# Patient Record
Sex: Female | Born: 1965 | Race: White | Hispanic: No | Marital: Married | Smoking: Former smoker
Health system: Southern US, Community
[De-identification: ages and names within clinical notes are randomized; demographics above are authoritative.]

## PROBLEM LIST (undated history)

## (undated) HISTORY — PX: OTHER SURGICAL HISTORY: SHX169

---

## 1997-08-02 ENCOUNTER — Emergency Department (HOSPITAL_COMMUNITY): Admission: EM | Admit: 1997-08-02 | Discharge: 1997-08-02 | Payer: Self-pay | Admitting: Emergency Medicine

## 1998-07-11 ENCOUNTER — Encounter: Payer: Self-pay | Admitting: Endocrinology

## 1998-07-11 ENCOUNTER — Emergency Department (HOSPITAL_COMMUNITY): Admission: EM | Admit: 1998-07-11 | Discharge: 1998-07-11 | Payer: Self-pay | Admitting: Endocrinology

## 1998-08-11 ENCOUNTER — Emergency Department (HOSPITAL_COMMUNITY): Admission: EM | Admit: 1998-08-11 | Discharge: 1998-08-11 | Payer: Self-pay | Admitting: Emergency Medicine

## 1998-08-11 ENCOUNTER — Encounter: Payer: Self-pay | Admitting: Emergency Medicine

## 1998-10-07 ENCOUNTER — Encounter: Admission: RE | Admit: 1998-10-07 | Discharge: 1999-01-05 | Payer: Self-pay | Admitting: Orthopedic Surgery

## 1998-10-09 ENCOUNTER — Emergency Department (HOSPITAL_COMMUNITY): Admission: EM | Admit: 1998-10-09 | Discharge: 1998-10-09 | Payer: Self-pay | Admitting: Emergency Medicine

## 2002-12-08 ENCOUNTER — Emergency Department (HOSPITAL_COMMUNITY): Admission: EM | Admit: 2002-12-08 | Discharge: 2002-12-08 | Payer: Self-pay | Admitting: Emergency Medicine

## 2005-02-22 ENCOUNTER — Emergency Department (HOSPITAL_COMMUNITY): Admission: EM | Admit: 2005-02-22 | Discharge: 2005-02-22 | Payer: Self-pay | Admitting: Emergency Medicine

## 2005-04-14 ENCOUNTER — Emergency Department (HOSPITAL_COMMUNITY): Admission: EM | Admit: 2005-04-14 | Discharge: 2005-04-14 | Payer: Self-pay | Admitting: Emergency Medicine

## 2007-08-26 ENCOUNTER — Emergency Department (HOSPITAL_COMMUNITY): Admission: EM | Admit: 2007-08-26 | Discharge: 2007-08-26 | Payer: Self-pay | Admitting: Emergency Medicine

## 2007-08-29 ENCOUNTER — Emergency Department (HOSPITAL_COMMUNITY): Admission: EM | Admit: 2007-08-29 | Discharge: 2007-08-29 | Payer: Self-pay | Admitting: Emergency Medicine

## 2010-11-09 ENCOUNTER — Emergency Department (HOSPITAL_COMMUNITY)
Admission: EM | Admit: 2010-11-09 | Discharge: 2010-11-10 | Discharge status: Against Medical Advice | Payer: PRIVATE HEALTH INSURANCE | Attending: Emergency Medicine | Admitting: Emergency Medicine

## 2010-11-09 DIAGNOSIS — R109 Unspecified abdominal pain: Secondary | ICD-10-CM | POA: Insufficient documentation

## 2010-11-09 LAB — POCT PREGNANCY, URINE: Preg Test, Ur: NEGATIVE

## 2010-11-10 LAB — URINALYSIS, ROUTINE W REFLEX MICROSCOPIC
Bilirubin Urine: NEGATIVE
Glucose, UA: NEGATIVE mg/dL
Hgb urine dipstick: NEGATIVE
Ketones, ur: NEGATIVE mg/dL
Leukocytes, UA: NEGATIVE
Nitrite: NEGATIVE
Protein, ur: NEGATIVE mg/dL
Specific Gravity, Urine: 1.006 (ref 1.005–1.030)
Urobilinogen, UA: 0.2 mg/dL (ref 0.0–1.0)
pH: 7 (ref 5.0–8.0)

## 2011-02-23 ENCOUNTER — Ambulatory Visit (INDEPENDENT_AMBULATORY_CARE_PROVIDER_SITE_OTHER): Payer: PRIVATE HEALTH INSURANCE | Admitting: Physician Assistant

## 2011-02-23 DIAGNOSIS — R197 Diarrhea, unspecified: Secondary | ICD-10-CM

## 2011-02-23 DIAGNOSIS — R112 Nausea with vomiting, unspecified: Secondary | ICD-10-CM

## 2011-02-23 MED ORDER — ONDANSETRON 4 MG PO TBDP: 4.0000 mg | ORAL_TABLET | Freq: Three times a day (TID) | ORAL | Status: DC | PRN

## 2011-02-23 MED ORDER — ONDANSETRON 4 MG PO TBDP
8.0000 mg | ORAL_TABLET | Freq: Once | ORAL | Status: AC
  Administered 2011-02-23: 8 mg via ORAL

## 2011-02-23 NOTE — Progress Notes (Signed)
  Subjective:    Patient ID: Leslie Morgan, female    DOB: 12-30-1965, 46 y.o.   MRN: [id number]  Emesis  This is a new (unable to keep any fluids down in >12h) problem. The current episode started yesterday. The problem occurs more than 10 times per day. The problem has been unchanged. Associated symptoms include chills, diarrhea and dizziness (with standing). Pertinent negatives include no abdominal pain or fever. She has tried nothing for the symptoms.  Diarrhea  This is a new problem. The current episode started yesterday. The problem occurs more than 10 times per day. The problem has been unchanged. The patient states that diarrhea awakens her from sleep. Associated symptoms include chills and vomiting. Pertinent negatives include no abdominal pain or fever. Risk factors include no known risk factors. She has tried nothing for the symptoms.      Review of Systems  Constitutional: Positive for chills. Negative for fever.  Gastrointestinal: Positive for vomiting and diarrhea. Negative for abdominal pain.  Neurological: Positive for dizziness (with standing) and weakness (generalized).       Objective:   Physical Exam  Constitutional: She appears well-developed and well-nourished. No distress.  HENT:  Head: Normocephalic and atraumatic.  Right Ear: External ear normal.  Left Ear: External ear normal.  Eyes: Pupils are equal, round, and reactive to light.  Cardiovascular: Normal rate, regular rhythm and normal heart sounds.   Pulmonary/Chest: Effort normal and breath sounds normal.  Abdominal: Soft.  Skin: She is not diaphoretic.          Assessment & Plan:   1. Nausea & vomiting  ondansetron (ZOFRAN-ODT) disintegrating tablet 8 mg, DISCONTINUED: ondansetron (ZOFRAN ODT) 4 MG disintegrating tablet, DISCONTINUED: ondansetron (ZOFRAN ODT) 4 MG disintegrating tablet  2. Diarrhea     Pt sent home after 2L NS IV feeling much better without any other episodes of vomiting. PT to  push fluids and advance diet as tolerated.

## 2013-10-03 ENCOUNTER — Telehealth: Payer: Self-pay | Admitting: Cardiovascular Disease

## 2013-10-03 NOTE — Telephone Encounter (Signed)
Closed encounter °

## 2013-11-08 ENCOUNTER — Ambulatory Visit: Payer: PRIVATE HEALTH INSURANCE | Admitting: Cardiovascular Disease

## 2014-01-06 ENCOUNTER — Emergency Department (HOSPITAL_COMMUNITY)
Admission: EM | Admit: 2014-01-06 | Discharge: 2014-01-06 | Disposition: A | Discharge status: Home / Self Care | Payer: PRIVATE HEALTH INSURANCE | Attending: Emergency Medicine | Admitting: Emergency Medicine

## 2014-01-06 ENCOUNTER — Encounter (HOSPITAL_COMMUNITY): Payer: Self-pay | Admitting: *Deleted

## 2014-01-06 ENCOUNTER — Emergency Department (HOSPITAL_COMMUNITY): Payer: PRIVATE HEALTH INSURANCE

## 2014-01-06 DIAGNOSIS — R52 Pain, unspecified: Secondary | ICD-10-CM

## 2014-01-06 DIAGNOSIS — M5431 Sciatica, right side: Secondary | ICD-10-CM | POA: Diagnosis not present

## 2014-01-06 DIAGNOSIS — Z79899 Other long term (current) drug therapy: Secondary | ICD-10-CM | POA: Insufficient documentation

## 2014-01-06 DIAGNOSIS — M549 Dorsalgia, unspecified: Secondary | ICD-10-CM | POA: Diagnosis present

## 2014-01-06 DIAGNOSIS — Z7952 Long term (current) use of systemic steroids: Secondary | ICD-10-CM | POA: Diagnosis not present

## 2014-01-06 DIAGNOSIS — Z720 Tobacco use: Secondary | ICD-10-CM | POA: Insufficient documentation

## 2014-01-06 DIAGNOSIS — Z791 Long term (current) use of non-steroidal anti-inflammatories (NSAID): Secondary | ICD-10-CM | POA: Diagnosis not present

## 2014-01-06 MED ORDER — OXYCODONE-ACETAMINOPHEN 5-325 MG PO TABS
1.0000 | ORAL_TABLET | Freq: Once | ORAL | Status: AC
  Administered 2014-01-06: 1 via ORAL
  Filled 2014-01-06: qty 1

## 2014-01-06 MED ORDER — DIAZEPAM 5 MG PO TABS
5.0000 mg | ORAL_TABLET | Freq: Once | ORAL | Status: AC
  Administered 2014-01-06: 5 mg via ORAL
  Filled 2014-01-06: qty 1

## 2014-01-06 MED ORDER — PREDNISONE 20 MG PO TABS
60.0000 mg | ORAL_TABLET | Freq: Once | ORAL | Status: AC
  Administered 2014-01-06: 60 mg via ORAL
  Filled 2014-01-06: qty 3

## 2014-01-06 MED ORDER — CYCLOBENZAPRINE HCL 10 MG PO TABS: 10.0000 mg | ORAL_TABLET | Freq: Two times a day (BID) | ORAL | Status: DC | PRN

## 2014-01-06 MED ORDER — OXYCODONE-ACETAMINOPHEN 5-325 MG PO TABS: 1.0000 | ORAL_TABLET | Freq: Four times a day (QID) | ORAL | Status: DC | PRN

## 2014-01-06 MED ORDER — ONDANSETRON 4 MG PO TBDP
4.0000 mg | ORAL_TABLET | Freq: Once | ORAL | Status: AC
  Administered 2014-01-06: 4 mg via ORAL
  Filled 2014-01-06: qty 1

## 2014-01-06 MED ORDER — PREDNISONE 10 MG PO TABS: 20.0000 mg | ORAL_TABLET | Freq: Every day | ORAL | Status: DC

## 2014-01-06 NOTE — ED Provider Notes (Signed)
CSN: [id number]     Arrival date & time 01/06/14  1427 History   First MD Initiated Contact with Patient 01/06/14 1627     Chief Complaint  Patient presents with  . Back Pain   The history is provided by the patient. No language interpreter was used.   This chart was scribed for non-physician practitioner Marlon Peliffany Lawson Isabell, PA-C,  working with Gray BernhardtWentz, Elliot MD, by Andrew Auaven Small, ED Scribe. This patient was seen in room WTR6/WTR6 and the patient's care was started at 7:29 AM.  Leslie Morgan is a 48 y.o. female who presents to the Emergency Department complaining of sudden, sharp mid back pain that radiates to right leg that began 4 days ago. Pt states pain began after getting off work suddenly. 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 Pt reports difficulty sleeping due to pain. She finds more comfort standing rather than sitting. Has been walking with limp due to pain. Pt has taken ibuprofen and tylenol, last does was 6 hours ago. Pt denies weakness in right extremity, abdominal pain, bladder and bowel incontinence.    History reviewed. No pertinent past medical history. History reviewed. No pertinent past surgical history. No family history on file. History  Substance Use Topics  . Smoking status: Current Every Day Smoker  . Smokeless tobacco: Not on file  . Alcohol Use: Not on file   OB History    No data available     Review of Systems  Gastrointestinal: Negative for abdominal pain.  Genitourinary: Negative for enuresis and difficulty urinating.  Musculoskeletal: Positive for back pain.   Allergies  Review of patient's allergies indicates no known allergies.  Home Medications   Prior to Admission medications   Medication Sig Start Date End Date Taking? Authorizing Provider  acetaminophen (TYLENOL) 500 MG tablet Take 1,000 mg by mouth every 6 (six) hours as needed for moderate pain.   Yes Historical Provider, MD  Heat Wraps (THERMACARE  MUSCLE/JOINT) MISC Apply 1 Package topically daily as needed (pain).   Yes Historical Provider, MD  ibuprofen (ADVIL,MOTRIN) 200 MG tablet Take 800 mg by mouth every 6 (six) hours as needed for moderate pain.   Yes Historical Provider, MD  naproxen sodium (ANAPROX) 220 MG tablet Take 440 mg by mouth 2 (two) times daily as needed (pain).   Yes Historical Provider, MD  cyclobenzaprine (FLEXERIL) 10 MG tablet Take 1 tablet (10 mg total) by mouth 2 (two) times daily as needed for muscle spasms. 01/06/14   Darus Hershman Irine SealG Khalea Ventura, PA-C  oxyCODONE-acetaminophen (PERCOCET/ROXICET) 5-325 MG per tablet Take 1-2 tablets by mouth every 6 (six) hours as needed for severe pain. 01/06/14   Joel Mericle Irine SealG Jniyah Dantuono, PA-C  predniSONE (DELTASONE) 10 MG tablet Take 2 tablets (20 mg total) by mouth daily. 01/06/14   Brean Carberry Irine SealG Zaidy Absher, PA-C   BP 110/72 mmHg  Pulse 71  Temp(Src) 98.1 F (36.7 C) (Oral)  Resp 18  Ht 5' (1.524 m)  Wt 118 lb (53.524 kg)  BMI 23.05 kg/m2  SpO2 100%  LMP 12/13/2013 Physical Exam  Constitutional: She is oriented to person, place, and time. She appears well-developed and well-nourished. No distress.  HENT:  Head: Normocephalic and atraumatic.  Eyes: Conjunctivae and EOM are normal.  Neck: Neck supple.  Cardiovascular: Normal rate.   Pulmonary/Chest: Effort normal.  Musculoskeletal: Normal range of motion.       Legs: Pt has equal strength to bilateral lower extremities.  Neurosensory function adequate  to both legs No clonus on dorsiflextion Skin color is normal. Skin is warm and moist.  I see no step off deformity, no midline bony tenderness.  Pt is able to ambulate.  No crepitus, laceration, effusion, induration, lesions, swelling.   Pedal pulses are symmetrical and palpable bilaterally  moderate tenderness to palpation of paraspinel muscles   Neurological: She is alert and oriented to person, place, and time.  Skin: Skin is warm and dry.  Psychiatric: She has a normal mood and affect.  Her behavior is normal.  Nursing note and vitals reviewed.   ED Course  Procedures (including critical care time) DIAGNOSTIC STUDIES: Oxygen Saturation is 100% on RA, normal by my interpretation.    COORDINATION OF CARE: 7:29 AM- Pt advised of plan for treatment and pt agrees.  Labs Review Labs Reviewed - No data to display  Imaging Review No results found.   EKG Interpretation None      MDM   Final diagnoses:  Pain  Sciatica neuralgia, right   Medications  oxyCODONE-acetaminophen (PERCOCET/ROXICET) 5-325 MG per tablet 1 tablet (1 tablet Oral Given 01/06/14 1645)  ondansetron (ZOFRAN-ODT) disintegrating tablet 4 mg (4 mg Oral Given 01/06/14 1645)  diazepam (VALIUM) tablet 5 mg (5 mg Oral Given 01/06/14 1730)  predniSONE (DELTASONE) tablet 60 mg (60 mg Oral Given 01/06/14 1730)   Medicines in ED significantly improved patients pain.  48 y.o.[name]'s  with R hip pain. No neurological deficits and normal neuro exam. Patient can walk. No loss of bowel or bladder control. No concern for cauda equina at this time base on HPI and physical exam findings. No fever, night sweats, weight loss, h/o cancer, IVDU.   RICE protocol and pain medicine indicated and discussed with patient.   Patient Plan 1. Medications: pain medication and muscle relaxer. Cont usual home medications unless otherwise directed. 2. Treatment: rest, drink plenty of fluids, gentle stretching as discussed, alternate ice and heat  3. Follow Up: Please followup with your primary doctor for discussion of your diagnoses and further evaluation after today's visit; if you do not have a primary care doctor use the resource guide provided to find one  Advised to follow-up with the orthopedist if symptoms do not start to resolve in the next 2-3 days. If develop loss of bowel or urinary control return to the ED as soon as possible for further evaluation. To take the medications as prescribed as they can cause harm  if not taken appropriately.   Vital signs are stable at discharge. Filed Vitals:   01/06/14 1735  BP: 110/72  Pulse: 71  Temp:   Resp: 18    Patient/guardian has voiced understanding and agreed to follow-up with the PCP or specialist.    I personally performed the services described in this documentation, which was scribed in my presence. The recorded information has been reviewed and is accurate.    Dorthula Matasiffany G Lionell Matuszak, PA-C 01/09/14 [phone]  Dorthula Matasiffany G Dyanara Cozza, PA-C 01/09/14 [id number]  Flint MelterElliott L Wentz, MD 01/11/14 [phone]

## 2014-01-06 NOTE — ED Notes (Signed)
Pt states she has back pain radiating to her right leg since Christmas eve night. Pt states she has taken ibuprofen, aleve, tylenol with no relief.

## 2014-01-06 NOTE — Discharge Instructions (Signed)
Sciatica Sciatica is pain, weakness, numbness, or tingling along the path of the sciatic nerve. The nerve starts in the lower back and runs down the back of each leg. The nerve controls the muscles in the lower leg and in the back of the knee, while also providing sensation to the back of the thigh, lower leg, and the sole of your foot. Sciatica is a symptom of another medical condition. For instance, nerve damage or certain conditions, such as a herniated disk or bone spur on the spine, pinch or put pressure on the sciatic nerve. This causes the pain, weakness, or other sensations normally associated with sciatica. Generally, sciatica only affects one side of the body. CAUSES   Herniated or slipped disc.  Degenerative disk disease.  A pain disorder involving the narrow muscle in the buttocks (piriformis syndrome).  Pelvic injury or fracture.  Pregnancy.  Tumor (rare). SYMPTOMS  Symptoms can vary from mild to very severe. The symptoms usually travel from the low back to the buttocks and down the back of the leg. Symptoms can include:  Mild tingling or dull aches in the lower back, leg, or hip.  Numbness in the back of the calf or sole of the foot.  Burning sensations in the lower back, leg, or hip.  Sharp pains in the lower back, leg, or hip.  Leg weakness.  Severe back pain inhibiting movement. These symptoms may get worse with coughing, sneezing, laughing, or prolonged sitting or standing. Also, being overweight may worsen symptoms. DIAGNOSIS  Your caregiver will perform a physical exam to look for common symptoms of sciatica. He or she may ask you to do certain movements or activities that would trigger sciatic nerve pain. Other tests may be performed to find the cause of the sciatica. These may include:  Blood tests.  X-rays.  Imaging tests, such as an MRI or CT scan. TREATMENT  Treatment is directed at the cause of the sciatic pain. Sometimes, treatment is not necessary  and the pain and discomfort goes away on its own. If treatment is needed, your caregiver may suggest:  Over-the-counter medicines to relieve pain.  Prescription medicines, such as anti-inflammatory medicine, muscle relaxants, or narcotics.  Applying heat or ice to the painful area.  Steroid injections to lessen pain, irritation, and inflammation around the nerve.  Reducing activity during periods of pain.  Exercising and stretching to strengthen your abdomen and improve flexibility of your spine. Your caregiver may suggest losing weight if the extra weight makes the back pain worse.  Physical therapy.  Surgery to eliminate what is pressing or pinching the nerve, such as a bone spur or part of a herniated disk. HOME CARE INSTRUCTIONS   Only take over-the-counter or prescription medicines for pain or discomfort as directed by your caregiver.  Apply ice to the affected area for 20 minutes, 3-4 times a day for the first 48-72 hours. Then try heat in the same way.  Exercise, stretch, or perform your usual activities if these do not aggravate your pain.  Attend physical therapy sessions as directed by your caregiver.  Keep all follow-up appointments as directed by your caregiver.  Do not wear high heels or shoes that do not provide proper support.  Check your mattress to see if it is too soft. A firm mattress may lessen your pain and discomfort. SEEK IMMEDIATE MEDICAL CARE IF:   You lose control of your bowel or bladder (incontinence).  You have increasing weakness in the lower back, pelvis, buttocks,   or legs.  You have redness or swelling of your back.  You have a burning sensation when you urinate.  You have pain that gets worse when you lie down or awakens you at night.  Your pain is worse than you have experienced in the past.  Your pain is lasting longer than 4 weeks.  You are suddenly losing weight without reason. MAKE SURE YOU:  Understand these  instructions.  Will watch your condition.  Will get help right away if you are not doing well or get worse. Document Released: 12/22/2000 Document Revised: 06/29/2011 Document Reviewed: 05/09/2011 Exit[employer] Patient Information 2015 [employer]. This information is not intended to replace advice given to you by your health care provider. Make sure you discuss any questions you have with your health care provider.  

## 2015-08-08 ENCOUNTER — Encounter (HOSPITAL_COMMUNITY): Payer: Self-pay | Admitting: Emergency Medicine

## 2015-08-08 ENCOUNTER — Emergency Department (HOSPITAL_COMMUNITY): Payer: PRIVATE HEALTH INSURANCE

## 2015-08-08 ENCOUNTER — Emergency Department (HOSPITAL_COMMUNITY)
Admission: EM | Admit: 2015-08-08 | Discharge: 2015-08-09 | Disposition: A | Discharge status: Home / Self Care | Payer: PRIVATE HEALTH INSURANCE | Attending: Emergency Medicine | Admitting: Emergency Medicine

## 2015-08-08 DIAGNOSIS — Y999 Unspecified external cause status: Secondary | ICD-10-CM | POA: Insufficient documentation

## 2015-08-08 DIAGNOSIS — Z5321 Procedure and treatment not carried out due to patient leaving prior to being seen by health care provider: Secondary | ICD-10-CM | POA: Diagnosis not present

## 2015-08-08 DIAGNOSIS — Y939 Activity, unspecified: Secondary | ICD-10-CM | POA: Diagnosis not present

## 2015-08-08 DIAGNOSIS — F17200 Nicotine dependence, unspecified, uncomplicated: Secondary | ICD-10-CM | POA: Diagnosis not present

## 2015-08-08 DIAGNOSIS — M25531 Pain in right wrist: Secondary | ICD-10-CM | POA: Insufficient documentation

## 2015-08-08 DIAGNOSIS — M79644 Pain in right finger(s): Secondary | ICD-10-CM | POA: Insufficient documentation

## 2015-08-08 DIAGNOSIS — Y92410 Unspecified street and highway as the place of occurrence of the external cause: Secondary | ICD-10-CM | POA: Diagnosis not present

## 2015-08-08 DIAGNOSIS — M25521 Pain in right elbow: Secondary | ICD-10-CM | POA: Insufficient documentation
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
## 2015-08-09 NOTE — ED Notes (Signed)
Called for pt in waiting room- no response

## 2016-04-22 ENCOUNTER — Encounter (HOSPITAL_COMMUNITY): Payer: Self-pay

## 2016-04-22 ENCOUNTER — Emergency Department (HOSPITAL_COMMUNITY)
Admission: EM | Admit: 2016-04-22 | Discharge: 2016-04-22 | Disposition: A | Discharge status: Home / Self Care | Payer: PRIVATE HEALTH INSURANCE | Attending: Emergency Medicine | Admitting: Emergency Medicine

## 2016-04-22 DIAGNOSIS — M5441 Lumbago with sciatica, right side: Secondary | ICD-10-CM | POA: Diagnosis not present

## 2016-04-22 DIAGNOSIS — Z79899 Other long term (current) drug therapy: Secondary | ICD-10-CM | POA: Insufficient documentation

## 2016-04-22 DIAGNOSIS — F17200 Nicotine dependence, unspecified, uncomplicated: Secondary | ICD-10-CM | POA: Insufficient documentation

## 2016-04-22 DIAGNOSIS — M545 Low back pain: Secondary | ICD-10-CM | POA: Diagnosis present

## 2016-04-22 MED ORDER — CYCLOBENZAPRINE HCL 5 MG PO TABS: 5.0000 mg | ORAL_TABLET | Freq: Three times a day (TID) | ORAL | 0 refills | Status: DC | PRN

## 2016-04-22 MED ORDER — HYDROCODONE-ACETAMINOPHEN 5-325 MG PO TABS: 1.0000 | ORAL_TABLET | Freq: Four times a day (QID) | ORAL | 0 refills | Status: DC | PRN

## 2016-04-22 MED ORDER — DEXAMETHASONE SODIUM PHOSPHATE 10 MG/ML IJ SOLN
4.0000 mg | Freq: Once | INTRAMUSCULAR | Status: AC
  Administered 2016-04-22: 4 mg via INTRAMUSCULAR
  Filled 2016-04-22: qty 1

## 2016-04-22 MED ORDER — PREDNISONE 20 MG PO TABS: ORAL_TABLET | ORAL | 0 refills | Status: DC

## 2016-04-22 MED ORDER — MORPHINE SULFATE (PF) 4 MG/ML IV SOLN
4.0000 mg | Freq: Once | INTRAVENOUS | Status: AC
  Administered 2016-04-22: 4 mg via INTRAMUSCULAR
  Filled 2016-04-22: qty 1

## 2016-04-22 MED ORDER — CYCLOBENZAPRINE HCL 10 MG PO TABS
5.0000 mg | ORAL_TABLET | Freq: Once | ORAL | Status: AC
Start: 2016-04-22 — End: 2016-04-22
  Administered 2016-04-22: 5 mg via ORAL
  Filled 2016-04-22: qty 1

## 2016-04-22 MED ORDER — HYDROCODONE-ACETAMINOPHEN 5-325 MG PO TABS: 2.0000 | ORAL_TABLET | Freq: Once | ORAL | Status: DC

## 2016-04-22 NOTE — ED Notes (Signed)
Pt stable, understands discharge instructions, and reasons for return.   

## 2016-04-22 NOTE — ED Provider Notes (Signed)
[facility]-EMERGENCY DEPT Provider Note   CSN: [id number] Arrival date & time: 04/22/16  1616  By signing my name below, I, [name], attest that this documentation has been prepared under the direction and in the presence of Charlynne Pander, MD. Electronically Signed: [name], Scribe. 04/22/16. 4:51 PM.  History   Chief Complaint Chief Complaint  Patient presents with  . Back Pain   HPI Comments: Leslie Morgan is a 51 y.o. female with no pertinent medical history, who presents to the Emergency Department complaining of sudden-onset, constant lower back pain radiating in the bilateral legs, that began 4-5 days ago. Patient has a history of sciatica, in which she was seen by a specialist and received a steroid injection in 2015, which significantly improved her pain. Patient states the last time she was here she had no relief with prednisone, narcotics, or flexeril. Patient was unable to tolerate oxycodone during last visit. Patient reports taking maximum strength ibuprofen with no relief in pain. Patient is ambulatory in the emergency department. Patient denies any injury, fall, weakness, numbness, or any other symptoms.   The history is provided by the patient. No language interpreter was used.    History reviewed. No pertinent past medical history.  There are no active problems to display for this patient.   History reviewed. No pertinent surgical history.  OB History    No data available       Home Medications    Prior to Admission medications   Medication Sig Start Date End Date Taking? Authorizing Provider  acetaminophen (TYLENOL) 500 MG tablet Take 1,000 mg by mouth every 6 (six) hours as needed for moderate pain.    Historical Provider, MD  cyclobenzaprine (FLEXERIL) 5 MG tablet Take 1 tablet (5 mg total) by mouth 3 (three) times daily as needed for muscle spasms. 04/22/16   Charlynne Pander, MD  Heat Wraps [facility] MUSCLE/JOINT) MISC Apply 1 Package topically  daily as needed (pain).    Historical Provider, MD  HYDROcodone-acetaminophen (NORCO/VICODIN) 5-325 MG tablet Take 1 tablet by mouth every 6 (six) hours as needed. 04/22/16   Charlynne Pander, MD  ibuprofen (ADVIL,MOTRIN) 200 MG tablet Take 800 mg by mouth every 6 (six) hours as needed for moderate pain.    Historical Provider, MD  naproxen sodium (ANAPROX) 220 MG tablet Take 440 mg by mouth 2 (two) times daily as needed (pain).    Historical Provider, MD  oxyCODONE-acetaminophen (PERCOCET/ROXICET) 5-325 MG per tablet Take 1-2 tablets by mouth every 6 (six) hours as needed for severe pain. 01/06/14   Tiffany Neva Seat, PA-C  predniSONE (DELTASONE) 20 MG tablet Take 60 mg daily x 2 days then 40 mg daily x 2 days then 20 mg daily x 2 days 04/22/16   Charlynne Pander, MD    Family History History reviewed. No pertinent family history.  Social History Social History  Substance Use Topics  . Smoking status: Current Every Day Smoker  . Smokeless tobacco: Never Used  . Alcohol use Yes     Allergies   Patient has no known allergies.   Review of Systems Review of Systems  Musculoskeletal: Positive for arthralgias (bilteral legs) and back pain (lower). Negative for gait problem and neck pain.  Neurological: Negative for weakness and numbness.  All other systems reviewed and are negative.    Physical Exam Updated Vital Signs BP 112/60 (BP Location: Left Arm)   Pulse 81   Temp 97.7 F (36.5 C) (Oral)   Resp  16   SpO2 97%   Physical Exam  Constitutional: She is oriented to person, place, and time. She appears well-developed and well-nourished.  HENT:  Head: Normocephalic.  Eyes: EOM are normal.  Neck: Normal range of motion.  Pulmonary/Chest: Effort normal.  Abdominal: She exhibits no distension.  Musculoskeletal: Normal range of motion. She exhibits tenderness. She exhibits no edema or deformity.  Diffuse para lumbar spasms. No midline deformity or tenderness.  Bilateral + straight  leg raise. Good reflexes.   Neurological: She is alert and oriented to person, place, and time.  Psychiatric: She has a normal mood and affect.  Nursing note and vitals reviewed.    ED Treatments / Results  DIAGNOSTIC STUDIES: Oxygen Saturation is 97% on RA, normal by my interpretation.    COORDINATION OF CARE: 4:51 PM Discussed treatment plan with pt at bedside and pt agreed to plan, which includes hydrocodone, flexeril, and a steroid shot.   Labs (all labs ordered are listed, but only abnormal results are displayed) Labs Reviewed - No data to display  EKG  EKG Interpretation None       Radiology No results found.  Procedures Procedures (including critical care time)  Medications Ordered in ED Medications  cyclobenzaprine (FLEXERIL) tablet 5 mg (5 mg Oral Given 04/22/16 1651)  dexamethasone (DECADRON) injection 4 mg (4 mg Intramuscular Given 04/22/16 1651)  morphine 4 MG/ML injection 4 mg (4 mg Intramuscular Given 04/22/16 1651)     Initial Impression / Assessment and Plan / ED Course  I have reviewed the triage vital signs and the nursing notes.  Pertinent labs & imaging results that were available during my care of the patient were reviewed by me and considered in my medical decision making (see chart for details).     Leslie Morgan is a 51 y.o. female here with back pain. Hx of sciatica previously. No fall or trauma or injury. Vitals stable. + straight leg raise bilaterally but neurovascular intact. Given decadron shot, morphine IM. Will dc home with course of steroids, motrin, flexeril, vicodin prn. Will refer to spine for possible cortisone shot or further evaluation.    Final Clinical Impressions(s) / ED Diagnoses   Final diagnoses:  Acute bilateral low back pain with right-sided sciatica    New Prescriptions New Prescriptions   CYCLOBENZAPRINE (FLEXERIL) 5 MG TABLET    Take 1 tablet (5 mg total) by mouth 3 (three) times daily as needed for muscle spasms.    HYDROCODONE-ACETAMINOPHEN (NORCO/VICODIN) 5-325 MG TABLET    Take 1 tablet by mouth every 6 (six) hours as needed.   PREDNISONE (DELTASONE) 20 MG TABLET    Take 60 mg daily x 2 days then 40 mg daily x 2 days then 20 mg daily x 2 days   I personally performed the services described in this documentation, which was scribed in my presence. The recorded information has been reviewed and is accurate.     Charlynne Pander, MD 04/22/16 [phone]

## 2016-04-22 NOTE — Discharge Instructions (Signed)
Take prednisone as prescribed.   Continue taking motrin for pain.  Take flexeril for muscle spasms.   Take vicodin for severe pain. Do NOT drive with it.   See your doctor. Consider calling Dr. Lonie Peak office for appointment for possible injection   Return to ER if you have severe back pain, unable to walk, weakness, numbness, trouble urinating

## 2016-04-22 NOTE — ED Triage Notes (Signed)
Pt states lumbar back pain radiating into her right leg. She reports using ibuprofen at home with minimal relief. Hx of trouble with her sciatic nerve requiring steroids.

## 2016-04-22 NOTE — ED Notes (Signed)
Pt c/o of recurring low back back radiating to bilateral lower extremities. Pt has taken ibuprofen  at home with no relief. Has had steroid injection in the past with full relief. See providers assessment.

## 2016-05-29 ENCOUNTER — Emergency Department (HOSPITAL_COMMUNITY)
Admission: EM | Admit: 2016-05-29 | Discharge: 2016-05-29 | Disposition: A | Discharge status: Home / Self Care | Payer: PRIVATE HEALTH INSURANCE | Attending: Emergency Medicine | Admitting: Emergency Medicine

## 2016-05-29 ENCOUNTER — Encounter (HOSPITAL_COMMUNITY): Payer: Self-pay | Admitting: Emergency Medicine

## 2016-05-29 DIAGNOSIS — Z5321 Procedure and treatment not carried out due to patient leaving prior to being seen by health care provider: Secondary | ICD-10-CM | POA: Insufficient documentation

## 2016-05-29 DIAGNOSIS — R21 Rash and other nonspecific skin eruption: Secondary | ICD-10-CM | POA: Insufficient documentation

## 2016-05-29 NOTE — ED Triage Notes (Signed)
Pt reports that she has a rash on her right leg, believes it is from poison ivy/oak from working in the yard a week ago.  Rash is on the front and back of her right leg, she has tried home cures but the rash continues to get worse.

## 2016-05-29 NOTE — ED Notes (Signed)
Pt name called x 2 with no response  

## 2016-05-30 ENCOUNTER — Emergency Department (HOSPITAL_COMMUNITY)
Admission: EM | Admit: 2016-05-30 | Discharge: 2016-05-30 | Disposition: A | Discharge status: Home / Self Care | Payer: PRIVATE HEALTH INSURANCE | Attending: Emergency Medicine | Admitting: Emergency Medicine

## 2016-05-30 ENCOUNTER — Encounter (HOSPITAL_COMMUNITY): Payer: Self-pay | Admitting: Emergency Medicine

## 2016-05-30 DIAGNOSIS — F17210 Nicotine dependence, cigarettes, uncomplicated: Secondary | ICD-10-CM | POA: Insufficient documentation

## 2016-05-30 DIAGNOSIS — B354 Tinea corporis: Secondary | ICD-10-CM | POA: Diagnosis not present

## 2016-05-30 DIAGNOSIS — R21 Rash and other nonspecific skin eruption: Secondary | ICD-10-CM

## 2016-05-30 MED ORDER — CLOTRIMAZOLE-BETAMETHASONE 1-0.05 % EX CREA: TOPICAL_CREAM | CUTANEOUS | 0 refills | Status: DC

## 2016-05-30 NOTE — ED Triage Notes (Signed)
Pt c/o rash to right leg x 1 week. Pt reports that she was working out in the yard recently. Pt has tried over the counter medications without relief.

## 2016-05-30 NOTE — ED Notes (Signed)
Declined W/C at D/C and was escorted to lobby by RN. 

## 2016-05-30 NOTE — ED Provider Notes (Signed)
[facility]-EMERGENCY DEPT Provider Note   CSN: [id number] Arrival date & time: 05/30/16  1245  By signing my name below, I, [name], attest that this documentation has been prepared under the direction and in the presence of Benjiman CorePickering, Domnique Vantine, MD. Electronically Signed: [name], ED Scribe. 05/30/16. 1:31 PM.  History   Chief Complaint Chief Complaint  Patient presents with  . Rash   The history is provided by the patient. No language interpreter was used.  Rash   The current episode started more than 1 week ago. The problem has been gradually worsening. The problem is associated with an unknown factor.    HPI Comments: Leslie Morgan is a 51 y.o. female with no pertinent PMHx, who presents to the Emergency Department complaining of sudden onset, profuse, localized right leg rash that started one week ago. Her associated symptoms include itching and burning sensation to the area. Pt notes last week while doing yard work there was a sudden onset of a rash to her right thigh, that  has since spread to the distal aspect of the thigh. At the time of the onset she notes she was wearing jeans. She denies any known contact with poison ivy or oak. She has tried Calamine lotion and Benadryl with no relief. No acute associated symptoms noted at this time.   History reviewed. No pertinent past medical history.  There are no active problems to display for this patient.   Past Surgical History:  Procedure Laterality Date  . arm surgery      OB History    No data available       Home Medications    Prior to Admission medications   Medication Sig Start Date End Date Taking? Authorizing Provider  acetaminophen (TYLENOL) 500 MG tablet Take 1,000 mg by mouth every 6 (six) hours as needed for moderate pain.    [provider]  clotrimazole-betamethasone (LOTRISONE) cream Apply to affected area 2 times daily prn 05/30/16   Benjiman CorePickering, Shamica Moree, MD  cyclobenzaprine (FLEXERIL) 5 MG tablet  Take 1 tablet (5 mg total) by mouth 3 (three) times daily as needed for muscle spasms. 04/22/16   Charlynne PanderYao, David Hsienta, MD  Heat Wraps [facility](THERMACARE MUSCLE/JOINT) MISC Apply 1 Package topically daily as needed (pain).    [provider]  HYDROcodone-acetaminophen (NORCO/VICODIN) 5-325 MG tablet Take 1 tablet by mouth every 6 (six) hours as needed. 04/22/16   Charlynne PanderYao, David Hsienta, MD  ibuprofen (ADVIL,MOTRIN) 200 MG tablet Take 800 mg by mouth every 6 (six) hours as needed for moderate pain.    [provider]  naproxen sodium (ANAPROX) 220 MG tablet Take 440 mg by mouth 2 (two) times daily as needed (pain).    [provider]  oxyCODONE-acetaminophen (PERCOCET/ROXICET) 5-325 MG per tablet Take 1-2 tablets by mouth every 6 (six) hours as needed for severe pain. 01/06/14   Marlon PelGreene, Tiffany, PA-C  predniSONE (DELTASONE) 20 MG tablet Take 60 mg daily x 2 days then 40 mg daily x 2 days then 20 mg daily x 2 days 04/22/16   Charlynne PanderYao, David Hsienta, MD    Family History No family history on file.  Social History Social History  Substance Use Topics  . Smoking status: Current Every Day Smoker    Packs/day: 0.50    Types: Cigarettes  . Smokeless tobacco: Never Used  . Alcohol use Yes     Allergies   Patient has no known allergies.   Review of Systems Review of Systems  Skin:  Positive for rash.       Burning sensation.      Physical Exam Updated Vital Signs BP (!) 132/93   Pulse 69   Temp 98.6 F (37 C) (Oral)   Resp 18   Ht 5' (1.524 m)   Wt 120 lb (54.4 kg)   LMP 05/24/2016   SpO2 100%   BMI 23.44 kg/m   Physical Exam  Constitutional: She is oriented to person, place, and time. She appears well-developed and well-nourished.  HENT:  Head: Normocephalic and atraumatic.  Cardiovascular: Normal rate.   Pulmonary/Chest: Effort normal.  Neurological: She is alert and oriented to person, place, and time.  Skin: Skin is warm and dry. Rash noted.  Multiple red  plaques right thigh. Is on anterior and posterior. No groin involvement. Her mildly raised slightly scaled. Appears somewhat uniform as opposed to ringed. no drainage.  Psychiatric: She has a normal mood and affect.  Nursing note and vitals reviewed.    ED Treatments / Results  COORDINATION OF CARE: 1:16 PM-Discussed next steps with pt. Pt verbalized understanding and is agreeable with the plan.    Labs (all labs ordered are listed, but only abnormal results are displayed)  Results for orders placed or performed during the hospital encounter of 11/09/10  Urinalysis, Routine w reflex microscopic  Result Value Ref Range   Color, Urine YELLOW YELLOW   APPearance CLEAR CLEAR   Specific Gravity, Urine 1.006 1.005 - 1.030   pH 7.0 5.0 - 8.0   Glucose, UA NEGATIVE NEGATIVE mg/dL   Hgb urine dipstick NEGATIVE NEGATIVE   Bilirubin Urine NEGATIVE NEGATIVE   Ketones, ur NEGATIVE NEGATIVE mg/dL   Protein, ur NEGATIVE NEGATIVE mg/dL   Urobilinogen, UA 0.2 0.0 - 1.0 mg/dL   Nitrite NEGATIVE NEGATIVE   Leukocytes, UA NEGATIVE NEGATIVE  Pregnancy, urine POC  Result Value Ref Range   Preg Test, Ur NEGATIVE    No results found.  EKG  EKG Interpretation None       Radiology No results found.  Procedures Procedures (including critical care time)  Medications Ordered in ED Medications - No data to display   Initial Impression / Assessment and Plan / ED Course  I have reviewed the triage vital signs and the nursing notes.  Pertinent labs & imaging results that were available during my care of the patient were reviewed by me and considered in my medical decision making (see chart for details).   patient with itchy areas to right thigh. May be tinea. Will treat with antifungal corticosteroid combination. Follow-up as needed.  Final Clinical Impressions(s) / ED Diagnoses   Final diagnoses:  Rash and nonspecific skin eruption  Tinea corporis    New Prescriptions New  Prescriptions   CLOTRIMAZOLE-BETAMETHASONE (LOTRISONE) CREAM    Apply to affected area 2 times daily prn  I personally performed the services described in this documentation, which was scribed in my presence. The recorded information has been reviewed and is accurate.       Benjiman Core, MD 05/30/16 [phone]

## 2016-09-26 ENCOUNTER — Emergency Department (HOSPITAL_COMMUNITY)
Admission: EM | Admit: 2016-09-26 | Discharge: 2016-09-26 | Disposition: A | Discharge status: Home / Self Care | Payer: PRIVATE HEALTH INSURANCE | Attending: Emergency Medicine | Admitting: Emergency Medicine

## 2016-09-26 ENCOUNTER — Encounter (HOSPITAL_COMMUNITY): Payer: Self-pay | Admitting: Emergency Medicine

## 2016-09-26 DIAGNOSIS — F17210 Nicotine dependence, cigarettes, uncomplicated: Secondary | ICD-10-CM | POA: Diagnosis not present

## 2016-09-26 DIAGNOSIS — Y658 Other specified misadventures during surgical and medical care: Secondary | ICD-10-CM | POA: Diagnosis not present

## 2016-09-26 DIAGNOSIS — Y929 Unspecified place or not applicable: Secondary | ICD-10-CM | POA: Insufficient documentation

## 2016-09-26 DIAGNOSIS — Z79899 Other long term (current) drug therapy: Secondary | ICD-10-CM | POA: Insufficient documentation

## 2016-09-26 DIAGNOSIS — T161XXA Foreign body in right ear, initial encounter: Secondary | ICD-10-CM | POA: Insufficient documentation

## 2016-09-26 DIAGNOSIS — Y93E8 Activity, other personal hygiene: Secondary | ICD-10-CM | POA: Diagnosis not present

## 2016-09-26 DIAGNOSIS — Y999 Unspecified external cause status: Secondary | ICD-10-CM | POA: Diagnosis not present

## 2016-09-26 DIAGNOSIS — H9201 Otalgia, right ear: Secondary | ICD-10-CM | POA: Diagnosis present

## 2016-09-26 MED ORDER — HYDROGEN PEROXIDE 3 % EX SOLN
CUTANEOUS | Status: AC
  Administered 2016-09-26: 17:00:00
  Filled 2016-09-26: qty 473

## 2016-09-26 MED ORDER — AMOXICILLIN 500 MG PO CAPS: 500.0000 mg | ORAL_CAPSULE | Freq: Two times a day (BID) | ORAL | 0 refills | Status: DC

## 2016-09-26 MED ORDER — NEOMYCIN-POLYMYXIN-HC 3.5-10000-1 OT SUSP: 4.0000 [drp] | Freq: Three times a day (TID) | OTIC | 0 refills | Status: AC

## 2016-09-26 NOTE — ED Triage Notes (Signed)
Patient c/o right ear fullness since using a qtip in it. Denies drainage.

## 2016-09-26 NOTE — Discharge Instructions (Signed)
Take medications as prescribed for 7 days. Do not put Q-tips in your ear. Please return to the emergency department if he develop any new or worsening symptoms. If your symptoms are persisting, please follow-up with the ear nose and throat doctor, Dr. Annalee Genta.

## 2016-09-26 NOTE — ED Provider Notes (Signed)
[facility]-EMERGENCY DEPT Provider Note   CSN: [id number] Arrival date & time: 09/26/16  1434     History   Chief Complaint Chief Complaint  Patient presents with  . Ear Fullness    HPI Leslie Morgan is a 51 y.o. female is a previously healthy who presents with a one-day history of right ear fullness after cleaning it with Q-tip. Patient believes piece of a Q-tip came off in her ear. She has used earwax removal eardrops without relief. She reports irritation, but no significant pain. She denies any fevers. She denies any other symptoms including nasal congestion.  HPI  History reviewed. No pertinent past medical history.  There are no active problems to display for this patient.   Past Surgical History:  Procedure Laterality Date  . arm surgery      OB History    No data available       Home Medications    Prior to Admission medications   Medication Sig Start Date End Date Taking? Authorizing Provider  acetaminophen (TYLENOL) 500 MG tablet Take 1,000 mg by mouth every 6 (six) hours as needed for moderate pain.    [provider]  amoxicillin (AMOXIL) 500 MG capsule Take 1 capsule (500 mg total) by mouth 2 (two) times daily. 09/26/16   Emi Holes, PA-C  clotrimazole-betamethasone (LOTRISONE) cream Apply to affected area 2 times daily prn 05/30/16   Benjiman Core, MD  cyclobenzaprine (FLEXERIL) 5 MG tablet Take 1 tablet (5 mg total) by mouth 3 (three) times daily as needed for muscle spasms. 04/22/16   Charlynne Pander, MD  Heat Wraps [facility] MUSCLE/JOINT) MISC Apply 1 Package topically daily as needed (pain).    [provider]  HYDROcodone-acetaminophen (NORCO/VICODIN) 5-325 MG tablet Take 1 tablet by mouth every 6 (six) hours as needed. 04/22/16   Charlynne Pander, MD  ibuprofen (ADVIL,MOTRIN) 200 MG tablet Take 800 mg by mouth every 6 (six) hours as needed for moderate pain.    [provider]  naproxen sodium (ANAPROX) 220 MG  tablet Take 440 mg by mouth 2 (two) times daily as needed (pain).    [provider]  neomycin-polymyxin-hydrocortisone (CORTISPORIN) 3.5-10000-1 OTIC suspension Place 4 drops into the right ear 3 (three) times daily. 09/26/16 10/03/16  Emi Holes, PA-C  oxyCODONE-acetaminophen (PERCOCET/ROXICET) 5-325 MG per tablet Take 1-2 tablets by mouth every 6 (six) hours as needed for severe pain. 01/06/14   Marlon Pel, PA-C  predniSONE (DELTASONE) 20 MG tablet Take 60 mg daily x 2 days then 40 mg daily x 2 days then 20 mg daily x 2 days 04/22/16   Charlynne Pander, MD    Family History History reviewed. No pertinent family history.  Social History Social History  Substance Use Topics  . Smoking status: Current Every Day Smoker    Packs/day: 0.50    Types: Cigarettes  . Smokeless tobacco: Never Used  . Alcohol use Yes     Allergies   Patient has no known allergies.   Review of Systems Review of Systems  HENT: Positive for ear pain.      Physical Exam Updated Vital Signs BP 104/75 (BP Location: Left Arm)   Pulse 69   Temp 97.9 F (36.6 C) (Oral)   Resp 16   SpO2 99%   Physical Exam  Constitutional: She appears well-developed and well-nourished. No distress.  HENT:  Head: Normocephalic and atraumatic.  Right Ear: A foreign body is present. No mastoid tenderness. Tympanic membrane  is erythematous.  Left Ear: Tympanic membrane normal.  Mouth/Throat: Oropharynx is clear and moist. No oropharyngeal exudate.  Eyes: Pupils are equal, round, and reactive to light. Conjunctivae are normal. Right eye exhibits no discharge. Left eye exhibits no discharge. No scleral icterus.  Neck: Normal range of motion. Neck supple. No thyromegaly present.  Cardiovascular: Normal rate, regular rhythm, normal heart sounds and intact distal pulses.  Exam reveals no gallop and no friction rub.   No murmur heard. Pulmonary/Chest: Effort normal and breath sounds normal. No stridor. No  respiratory distress. She has no wheezes. She has no rales.  Musculoskeletal: She exhibits no edema.  Lymphadenopathy:    She has no cervical adenopathy.  Neurological: She is alert. Coordination normal.  Skin: Skin is warm and dry. No rash noted. She is not diaphoretic. No pallor.  Psychiatric: She has a normal mood and affect.  Nursing note and vitals reviewed.    ED Treatments / Results  Labs (all labs ordered are listed, but only abnormal results are displayed) Labs Reviewed - No data to display  EKG  EKG Interpretation None       Radiology No results found.  Procedures Procedures (including critical care time)  Medications Ordered in ED Medications  hydrogen peroxide 3 % external solution (  Given 09/26/16 1717)     Initial Impression / Assessment and Plan / ED Course  I have reviewed the triage vital signs and the nursing notes.  Pertinent labs & imaging results that were available during my care of the patient were reviewed by me and considered in my medical decision making (see chart for details).     Irrigation of the right ear conducted by nursing staff which removed wax and what looked to be cotton from Q-tip. On evaluation, canal mildly erythematous and TM erythematous and dull. We'll discharge home with coverage for otitis media and prophylactic treatment for externa after irrigation and foreign body. Discharge home with amoxicillin and Cortisporin. Return precautions discussed. Follow-up to ENT if symptoms are persistent. Patient understands and agrees with plan. Patient vitals stable throughout ED course and discharged in satisfactory condition.  Final Clinical Impressions(s) / ED Diagnoses   Final diagnoses:  Foreign body of right ear, initial encounter  Otalgia of right ear    New Prescriptions Discharge Medication List as of 09/26/2016  5:11 PM    START taking these medications   Details  amoxicillin (AMOXIL) 500 MG capsule Take 1 capsule (500  mg total) by mouth 2 (two) times daily., Starting Sun 09/26/2016, Print    neomycin-polymyxin-hydrocortisone (CORTISPORIN) 3.5-10000-1 OTIC suspension Place 4 drops into the right ear 3 (three) times daily., Starting Sun 09/26/2016, Until Sun 10/03/2016, Print          Valentine Kuechle, Waylan Boga, PA-C 09/26/16 Jacqlyn Krauss, MD 09/29/16 [phone]

## 2016-09-26 NOTE — ED Notes (Signed)
Pt's ear flushed with equal parts hydrogen peroxide and warm water.  Ear wax and cotton came out.  PA made aware. Pt reports improvement in right ear pain.

## 2018-10-19 ENCOUNTER — Ambulatory Visit: Payer: PRIVATE HEALTH INSURANCE | Admitting: Family Medicine

## 2018-10-19 ENCOUNTER — Other Ambulatory Visit: Payer: Self-pay

## 2018-10-19 VITALS — BP 92/80 | HR 71 | Temp 97.2°F | Resp 18

## 2018-10-19 DIAGNOSIS — B349 Viral infection, unspecified: Secondary | ICD-10-CM

## 2018-10-19 NOTE — Patient Instructions (Addendum)
Please stay out of work until you know your covid-19 test results and symptoms have improved. Please call and leave a message with your covid-19 test result at ext. [phone]    COVID-19 COVID-19 is a respiratory infection that is caused by a virus called severe acute respiratory syndrome coronavirus 2 (SARS-CoV-2). The disease is also known as coronavirus disease or novel coronavirus. In some people, the virus may not cause any symptoms. In others, it may cause a serious infection. The infection can get worse quickly and can lead to complications, such as:  Pneumonia, or infection of the lungs.  Acute respiratory distress syndrome or ARDS. This is fluid build-up in the lungs.  Acute respiratory failure. This is a condition in which there is not enough oxygen passing from the lungs to the body.  Sepsis or septic shock. This is a serious bodily reaction to an infection.  Blood clotting problems.  Secondary infections due to bacteria or fungus. The virus that causes COVID-19 is contagious. This means that it can spread from person to person through droplets from coughs and sneezes (respiratory secretions). What are the causes? This illness is caused by a virus. You may catch the virus by:  Breathing in droplets from an infected person's cough or sneeze.  Touching something, like a table or a doorknob, that was exposed to the virus (contaminated) and then touching your mouth, nose, or eyes. What increases the risk? Risk for infection You are more likely to be infected with this virus if you:  Live in or travel to an area with a COVID-19 outbreak.  Come in contact with a sick person who recently traveled to an area with a COVID-19 outbreak.  Provide care for or live with a person who is infected with COVID-19. Risk for serious illness You are more likely to become seriously ill from the virus if you:  Are 3 years of age or older.  Have a long-term disease that lowers your body's  ability to fight infection (immunocompromised).  Live in a nursing home or long-term care facility.  Have a long-term (chronic) disease such as: ? Chronic lung disease, including chronic obstructive pulmonary disease or asthma ? Heart disease. ? Diabetes. ? Chronic kidney disease. ? Liver disease.  Are obese. What are the signs or symptoms? Symptoms of this condition can range from mild to severe. Symptoms may appear any time from 2 to 14 days after being exposed to the virus. They include:  A fever.  A cough.  Difficulty breathing.  Chills.  Muscle pains.  A sore throat.  Loss of taste or smell. Some people may also have stomach problems, such as nausea, vomiting, or diarrhea. Other people may not have any symptoms of COVID-19. How is this diagnosed? This condition may be diagnosed based on:  Your signs and symptoms, especially if: ? You live in an area with a COVID-19 outbreak. ? You recently traveled to or from an area where the virus is common. ? You provide care for or live with a person who was diagnosed with COVID-19.  A physical exam.  Lab tests, which may include: ? A nasal swab to take a sample of fluid from your nose. ? A throat swab to take a sample of fluid from your throat. ? A sample of mucus from your lungs (sputum). ? Blood tests.  Imaging tests, which may include, X-rays, CT scan, or ultrasound. How is this treated? At present, there is no medicine to treat COVID-19. Medicines that treat  other diseases are being used on a trial basis to see if they are effective against COVID-19. Your health care provider will talk with you about ways to treat your symptoms. For most people, the infection is mild and can be managed at home with rest, fluids, and over-the-counter medicines. Treatment for a serious infection usually takes places in a hospital intensive care unit (ICU). It may include one or more of the following treatments. These treatments are given  until your symptoms improve.  Receiving fluids and medicines through an IV.  Supplemental oxygen. Extra oxygen is given through a tube in the nose, a face mask, or a hood.  Positioning you to lie on your stomach (prone position). This makes it easier for oxygen to get into the lungs.  Continuous positive airway pressure (CPAP) or bi-level positive airway pressure (BPAP) machine. This treatment uses mild air pressure to keep the airways open. A tube that is connected to a motor delivers oxygen to the body.  Ventilator. This treatment moves air into and out of the lungs by using a tube that is placed in your windpipe.  Tracheostomy. This is a procedure to create a hole in the neck so that a breathing tube can be inserted.  Extracorporeal membrane oxygenation (ECMO). This procedure gives the lungs a chance to recover by taking over the functions of the heart and lungs. It supplies oxygen to the body and removes carbon dioxide. Follow these instructions at home: Lifestyle  If you are sick, stay home except to get medical care. Your health care provider will tell you how long to stay home. Call your health care provider before you go for medical care.  Rest at home as told by your health care provider.  Do not use any products that contain nicotine or tobacco, such as cigarettes, e-cigarettes, and chewing tobacco. If you need help quitting, ask your health care provider.  Return to your normal activities as told by your health care provider. Ask your health care provider what activities are safe for you. General instructions  Take over-the-counter and prescription medicines only as told by your health care provider.  Drink enough fluid to keep your urine pale yellow.  Keep all follow-up visits as told by your health care provider. This is important. How is this prevented?  There is no vaccine to help prevent COVID-19 infection. However, there are steps you can take to protect yourself  and others from this virus. To protect yourself:   Do not travel to areas where COVID-19 is a risk. The areas where COVID-19 is reported change often. To identify high-risk areas and travel restrictions, check the CDC travel website: StageSync.si  If you live in, or must travel to, an area where COVID-19 is a risk, take precautions to avoid infection. ? Stay away from people who are sick. ? Wash your hands often with soap and water for 20 seconds. If soap and water are not available, use an alcohol-based hand sanitizer. ? Avoid touching your mouth, face, eyes, or nose. ? Avoid going out in public, follow guidance from your state and local health authorities. ? If you must go out in public, wear a cloth face covering or face mask. ? Disinfect objects and surfaces that are frequently touched every day. This may include:  Counters and tables.  Doorknobs and light switches.  Sinks and faucets.  Electronics, such as phones, remote controls, keyboards, computers, and tablets. To protect others: If you have symptoms of COVID-19, take steps to  prevent the virus from spreading to others.  If you think you have a COVID-19 infection, contact your health care provider right away. Tell your health care team that you think you may have a COVID-19 infection.  Stay home. Leave your house only to seek medical care. Do not use public transport.  Do not travel while you are sick.  Wash your hands often with soap and water for 20 seconds. If soap and water are not available, use alcohol-based hand sanitizer.  Stay away from other members of your household. Let healthy household members care for children and pets, if possible. If you have to care for children or pets, wash your hands often and wear a mask. If possible, stay in your own room, separate from others. Use a different bathroom.  Make sure that all people in your household wash their hands well and often.  Cough or sneeze into  a tissue or your sleeve or elbow. Do not cough or sneeze into your hand or into the air.  Wear a cloth face covering or face mask. Where to find more information  Centers for Disease Control and Prevention: StickerEmporium.tnwww.cdc.gov/coronavirus/2019-ncov/index.html  World Health Organization: https://thompson-craig.com/www.who.int/health-topics/coronavirus Contact a health care provider if:  You live in or have traveled to an area where COVID-19 is a risk and you have symptoms of the infection.  You have had contact with someone who has COVID-19 and you have symptoms of the infection. Get help right away if:  You have trouble breathing.  You have pain or pressure in your chest.  You have confusion.  You have bluish lips and fingernails.  You have difficulty waking from sleep.  You have symptoms that get worse. These symptoms may represent a serious problem that is an emergency. Do not wait to see if the symptoms will go away. Get medical help right away. Call your local emergency services (911 in the [place] Do not drive yourself to the hospital. Let the emergency medical personnel know if you think you have COVID-19. Summary  COVID-19 is a respiratory infection that is caused by a virus. It is also known as coronavirus disease or novel coronavirus. It can cause serious infections, such as pneumonia, acute respiratory distress syndrome, acute respiratory failure, or sepsis.  The virus that causes COVID-19 is contagious. This means that it can spread from person to person through droplets from coughs and sneezes.  You are more likely to develop a serious illness if you are 51 years of age or older, have a weak immunity, live in a nursing home, or have chronic disease.  There is no medicine to treat COVID-19. Your health care provider will talk with you about ways to treat your symptoms.  Take steps to protect yourself and others from infection. Wash your hands often and disinfect objects and surfaces that are frequently  touched every day. Stay away from people who are sick and wear a mask if you are sick. This information is not intended to replace advice given to you by your health care provider. Make sure you discuss any questions you have with your health care provider. Document Released: 02/02/2018 Document Revised: 05/25/2018 Document Reviewed: 02/02/2018 Elsevier Patient Education  2020 [employer] Sinusitis, Adult Sinusitis is soreness and swelling (inflammation) of your sinuses. Sinuses are hollow spaces in the bones around your face. They are located:  Around your eyes.  In the middle of your forehead.  Behind your nose.  In your cheekbones. Your sinuses and nasal passages are lined  with a fluid called mucus. Mucus drains out of your sinuses. Swelling can trap mucus in your sinuses. This lets germs (bacteria, virus, or fungus) grow, which leads to infection. Most of the time, this condition is caused by a virus. What are the causes? This condition is caused by:  Allergies.  Asthma.  Germs.  Things that block your nose or sinuses.  Growths in the nose (nasal polyps).  Chemicals or irritants in the air.  Fungus (rare). What increases the risk? You are more likely to develop this condition if:  You have a weak body defense system (immune system).  You do a lot of swimming or diving.  You use nasal sprays too much.  You smoke. What are the signs or symptoms? The main symptoms of this condition are pain and a feeling of pressure around the sinuses. Other symptoms include:  Stuffy nose (congestion).  Runny nose (drainage).  Swelling and warmth in the sinuses.  Headache.  Toothache.  A cough that may get worse at night.  Mucus that collects in the throat or the back of the nose (postnasal drip).  Being unable to smell and taste.  Being very tired (fatigue).  A fever.  Sore throat.  Bad breath. How is this diagnosed? This condition is diagnosed based  on:  Your symptoms.  Your medical history.  A physical exam.  Tests to find out if your condition is short-term (acute) or long-term (chronic). Your doctor may: ? Check your nose for growths (polyps). ? Check your sinuses using a tool that has a light (endoscope). ? Check for allergies or germs. ? Do imaging tests, such as an MRI or CT scan. How is this treated? Treatment for this condition depends on the cause and whether it is short-term or long-term.  If caused by a virus, your symptoms should go away on their own within 10 days. You may be given medicines to relieve symptoms. They include: ? Medicines that shrink swollen tissue in the nose. ? Medicines that treat allergies (antihistamines). ? A spray that treats swelling of the nostrils. ? Rinses that help get rid of thick mucus in your nose (nasal saline washes).  If caused by bacteria, your doctor may wait to see if you will get better without treatment. You may be given antibiotic medicine if you have: ? A very bad infection. ? A weak body defense system.  If caused by growths in the nose, you may need to have surgery. Follow these instructions at home: Medicines  Take, use, or apply over-the-counter and prescription medicines only as told by your doctor. These may include nasal sprays.  If you were prescribed an antibiotic medicine, take it as told by your doctor. Do not stop taking the antibiotic even if you start to feel better. Hydrate and humidify   Drink enough water to keep your pee (urine) pale yellow.  Use a cool mist humidifier to keep the humidity level in your home above 50%.  Breathe in steam for 10-15 minutes, 3-4 times a day, or as told by your doctor. You can do this in the bathroom while a hot shower is running.  Try not to spend time in cool or dry air. Rest  Rest as much as you can.  Sleep with your head raised (elevated).  Make sure you get enough sleep each night. General  instructions   Put a warm, moist washcloth on your face 3-4 times a day, or as often as told by your doctor. This  will help with discomfort.  Wash your hands often with soap and water. If there is no soap and water, use hand sanitizer.  Do not smoke. Avoid being around people who are smoking (secondhand smoke).  Keep all follow-up visits as told by your doctor. This is important. Contact a doctor if:  You have a fever.  Your symptoms get worse.  Your symptoms do not get better within 10 days. Get help right away if:  You have a very bad headache.  You cannot stop throwing up (vomiting).  You have very bad pain or swelling around your face or eyes.  You have trouble seeing.  You feel confused.  Your neck is stiff.  You have trouble breathing. Summary  Sinusitis is swelling of your sinuses. Sinuses are hollow spaces in the bones around your face.  This condition is caused by tissues in your nose that become inflamed or swollen. This traps germs. These can lead to infection.  If you were prescribed an antibiotic medicine, take it as told by your doctor. Do not stop taking it even if you start to feel better.  Keep all follow-up visits as told by your doctor. This is important. This information is not intended to replace advice given to you by your health care provider. Make sure you discuss any questions you have with your health care provider. Document Released: 06/16/2007 Document Revised: 05/30/2017 Document Reviewed: 05/30/2017 Elsevier Patient Education  2020 [employer].

## 2018-10-19 NOTE — Progress Notes (Addendum)
Subjective:     Patient ID: Leslie Morgan, female   DOB: June 15, 1965, 53 y.o.   MRN: [id number]  Christia presents to the [facility] clinic today with URI complaints. She reports 1 week ago woke up with a sore throat, next day started with cough and chills. States chills only lasted that one night, none since. No known fever. Sore throat has resolved. Reports multiple episodes of diarrhea 3 days ago, has resolved. Currently has non-productive cough, frontal h/a, light-headedness when she moves her head certain ways, and shortness of breath at times. Denies any vision changes or N/V, no loss of taste or smell. She lives at home with her son and husband. States son had a sore throat but no other symptoms and is well now. Husband had diarrhea but no other symptoms and is well now. She has not been in contact with anyone with confirmed Covid-19 or PUI. She gets weekly covid-19 testing at work, last test was negative on 10/11/18 per patient, awaiting results from testing done on 10/18/18. No recent travel. She is a former smoker, quit 6 months ago, states she has dipped a couple times since then.      Review of Systems  Constitutional: Positive for chills. Negative for activity change, appetite change, fever and unexpected weight change.  HENT: Positive for congestion, rhinorrhea, sinus pressure and sore throat. Negative for dental problem, ear discharge, ear pain, facial swelling and trouble swallowing.   Eyes: Negative for photophobia, pain, discharge, itching and visual disturbance.  Respiratory: Positive for cough and shortness of breath. Negative for wheezing.   Cardiovascular: Negative for chest pain.  Gastrointestinal: Positive for diarrhea. Negative for abdominal pain, constipation, nausea and vomiting.  Musculoskeletal: Positive for myalgias.  Neurological: Positive for light-headedness. Negative for syncope.       Objective:   Physical Exam Vitals signs reviewed.  Constitutional:      General: She  is not in acute distress.    Appearance: Normal appearance. She is not toxic-appearing.  HENT:     Head: Normocephalic and atraumatic.     Right Ear: Tympanic membrane normal.     Left Ear: Tympanic membrane normal.     Nose:     Right Sinus: No maxillary sinus tenderness or frontal sinus tenderness.     Left Sinus: Maxillary sinus tenderness present. No frontal sinus tenderness.     Mouth/Throat:     Lips: Pink.     Mouth: Mucous membranes are moist.     Pharynx: Oropharynx is clear.  Eyes:     General:        Right eye: No discharge.        Left eye: No discharge.  Neck:     Musculoskeletal: Neck supple. No neck rigidity.  Cardiovascular:     Rate and Rhythm: Normal rate and regular rhythm.     Heart sounds: Normal heart sounds.  Pulmonary:     Effort: Pulmonary effort is normal. No respiratory distress.     Breath sounds: Normal breath sounds. No wheezing, rhonchi or rales.  Lymphadenopathy:     Cervical: No cervical adenopathy.  Skin:    General: Skin is warm and dry.  Neurological:     General: No focal deficit present.     Mental Status: She is alert and oriented to person, place, and time.  Psychiatric:        Mood and Affect: Mood normal.        Behavior: Behavior normal.  Assessment:     Viral illness      Plan:     1. Pt is A/O, no signs of distress, speaking in complete sentences, lungs are clear to auscultation, no evidence of PNA. No evidence of need for antibiotic at this time. Discussed with patient potential for Covid-19 based on her symptoms. She should stay home until test results are back and she is asymptomatic. Continue wearing mask and proper hand hygiene. Testing was done through her work on 10/18/18, she states results should be back tomorrow on 10/20/18. She will call with results. If negative she may return to work when symptoms have improved.  2. Potential for sinusitis if covid-19 is negative. Conservative treatment measures discussed.  Discussed likely caused by a virus and will improve on its own. If symptoms continue or worsen over the next several days recommend she f/u with a call and I can order an antibiotic at that time. But do not feel an antibiotic is necessary at this time.  3. Discussed with patient that if her symptoms worsen, she develops fever, worsening shortness of breath, or any other concerning symptoms she needs to go to ED to be evaluated and treated. She verbalized understanding.

## 2018-10-24 ENCOUNTER — Telehealth: Payer: Self-pay | Admitting: Family Medicine

## 2018-10-24 NOTE — Telephone Encounter (Signed)
Late Note: Spoke with patient and her employer on 10/20/18. Per pt's employer covid-19 testing was negative. Pt reports feeling better with symptomatic treatment of robitussin cough medicine and saline nasal spray. Instructed patient to call if symptoms worsen or fail to improve over the next several days. She verbalized understanding.

## 2019-02-08 ENCOUNTER — Ambulatory Visit: Payer: PRIVATE HEALTH INSURANCE | Admitting: Family Medicine

## 2019-02-08 ENCOUNTER — Other Ambulatory Visit: Payer: Self-pay | Admitting: Family Medicine

## 2019-02-08 ENCOUNTER — Encounter: Payer: Self-pay | Admitting: Family Medicine

## 2019-02-08 VITALS — BP 100/64 | HR 60 | Resp 18 | Ht 60.0 in | Wt 127.0 lb

## 2019-02-08 DIAGNOSIS — Z789 Other specified health status: Secondary | ICD-10-CM

## 2019-02-08 DIAGNOSIS — R42 Dizziness and giddiness: Secondary | ICD-10-CM

## 2019-02-08 NOTE — Progress Notes (Signed)
Subjective:     Patient ID: Leslie Morgan, female   DOB: 02-May-1965, 54 y.o.   MRN: [id number]  HPI Leslie Morgan presents to the employee health clinic today for her required wellness visit for her insurance. PMH and health maintenance items were reviewed. She does not have a PCP. She states she just goes to urgent care when she has a problem. Has not had any lab work done in years. Reports never had a mammogram or colonoscopy. It's been 20 years since her last pap smear. She went through menopause two years ago, denies any vaginal bleeding since. She quit smoking 1.5 years ago, denies any smokeless tobacco use or drug use. Reports occasional glass of wine. She states she has been so used to taking care of everyone else she hasn't done a good job of taking care of herself and wants to change that this year. She has an eye exam scheduled for next week. Wants to schedule a dental exam as well.   She reports that at times she feels dizzy like she's going to pass out, but does not. States this occurs with sudden movements, like looking up quickly, or standing up fast. States it last just a moment and then she feels fine. Denies any h/a, vision changes, chest pain, shortness of breath or diaphoresis with these episodes. She states she rarely drinks water and eats mostly at night, does not eat much throughout the day when she is working.   No past medical history on file. No Known Allergies  Current Outpatient Medications:  .  acetaminophen (TYLENOL) 500 MG tablet, Take 1,000 mg by mouth every 6 (six) hours as needed for moderate pain., Disp: , Rfl:  .  ibuprofen (ADVIL,MOTRIN) 200 MG tablet, Take 800 mg by mouth every 6 (six) hours as needed for moderate pain., Disp: , Rfl:  .  naproxen sodium (ANAPROX) 220 MG tablet, Take 440 mg by mouth 2 (two) times daily as needed (pain)., Disp: , Rfl:     Review of Systems  Constitutional: Negative for chills, fatigue, fever and unexpected weight change.  HENT:  Negative for congestion, ear pain, sinus pressure, sinus pain and sore throat.   Eyes: Negative for discharge and visual disturbance.  Respiratory: Negative for cough, shortness of breath and wheezing.   Cardiovascular: Negative for chest pain and leg swelling.  Gastrointestinal: Negative for abdominal pain, blood in stool, constipation, diarrhea, nausea and vomiting.  Genitourinary: Negative for difficulty urinating and hematuria.  Skin: Negative for color change.  Neurological: Negative for syncope, weakness and headaches.  Hematological: Negative for adenopathy.  All other systems reviewed and are negative.      Objective:   Physical Exam Vitals and nursing note reviewed.  Constitutional:      General: She is not in acute distress.    Appearance: Normal appearance. She is well-developed. She is not toxic-appearing.  HENT:     Head: Normocephalic and atraumatic.  Eyes:     General:        Right eye: No discharge.        Left eye: No discharge.     Pupils: Pupils are equal, round, and reactive to light.  Cardiovascular:     Rate and Rhythm: Normal rate and regular rhythm.     Pulses: Normal pulses.     Heart sounds: Normal heart sounds. No murmur.  Pulmonary:     Effort: Pulmonary effort is normal. No respiratory distress.     Breath sounds: Normal breath sounds.  Musculoskeletal:     Cervical back: Neck supple.  Skin:    General: Skin is warm and dry.  Neurological:     General: No focal deficit present.     Mental Status: She is alert and oriented to person, place, and time.     Motor: No weakness.     Gait: Gait normal.  Psychiatric:        Mood and Affect: Mood normal.        Behavior: Behavior normal.    Today's Vitals   02/08/19 1439  BP: 100/64  Pulse: 60  Resp: 18  SpO2: 99%  Weight: 127 lb (57.6 kg)  Height: 5' (1.524 m)   Body mass index is 24.8 kg/m.      Assessment:     1. Participant in health and wellness plan Pt given Onycha's  physician referral phone number to get help with finding a PCP who is accepting new patients. She will call and get an appt scheduled for a physical. Discussed importance of self-care measures and screening tests. Will do basic lab work of lipid panel and CMP today and will notify pt of results. Scheduled pt for onsite mammogram on 3/25. Recommend getting colonoscopy scheduled when she has appt with her new PCP, as well as getting pap smear done. Encouraged healthy diet and exercise. F/u here prn.  2. Dizziness Exam normal, no red flags noted. BP is on the lower side at 100/64. Discussed possibility of orthostatic hypotension with position changes. Recommend changing positions slowly. Pt also does not stay well hydrated, recommend increasing her water intake and eating smalls meals at regular intervals throughout the day. If continues or worsens please f/u. If develops syncope, chest pain, shortness of breath, h/a, or other concerning symptoms please be seen emergently. Pt verbalized understanding.      Plan:     See above.

## 2019-02-09 LAB — COMPLETE METABOLIC PANEL WITH GFR
AG Ratio: 1.9 (calc) (ref 1.0–2.5)
ALT: 9 U/L (ref 6–29)
AST: 14 U/L (ref 10–35)
Albumin: 4.4 g/dL (ref 3.6–5.1)
Alkaline phosphatase (APISO): 85 U/L (ref 37–153)
BUN: 14 mg/dL (ref 7–25)
CO2: 22 mmol/L (ref 20–32)
Calcium: 9.3 mg/dL (ref 8.6–10.4)
Chloride: 106 mmol/L (ref 98–110)
Creat: 0.73 mg/dL (ref 0.50–1.05)
GFR, Est African American: 111 mL/min/{1.73_m2} (ref >=60)
GFR, Est Non African American: 95 mL/min/{1.73_m2} (ref >=60)
Globulin: 2.3 g/dL (calc) (ref 1.9–3.7)
Glucose, Bld: 110 mg/dL — ABNORMAL HIGH (ref 65–99)
Potassium: 3.8 mmol/L (ref 3.5–5.3)
Sodium: 140 mmol/L (ref 135–146)
Total Bilirubin: 0.6 mg/dL (ref 0.2–1.2)
Total Protein: 6.7 g/dL (ref 6.1–8.1)

## 2019-02-09 LAB — LIPID PANEL
Cholesterol: 209 mg/dL — ABNORMAL HIGH (ref <200)
HDL: 47 mg/dL — ABNORMAL LOW (ref >=50)
LDL Cholesterol (Calc): 132 mg/dL (calc) — ABNORMAL HIGH
Non-HDL Cholesterol (Calc): 162 mg/dL (calc) — ABNORMAL HIGH (ref <130)
Total CHOL/HDL Ratio: 4.4 (calc) (ref <5.0)
Triglycerides: 167 mg/dL — ABNORMAL HIGH (ref <150)

## 2019-02-15 ENCOUNTER — Ambulatory Visit: Payer: PRIVATE HEALTH INSURANCE | Admitting: Family Medicine

## 2019-02-15 ENCOUNTER — Other Ambulatory Visit: Payer: Self-pay | Admitting: Family Medicine

## 2019-02-15 VITALS — BP 100/60 | HR 62

## 2019-02-15 NOTE — Progress Notes (Signed)
  Subjective:     Patient ID: Leslie Morgan, female   DOB: [birth year], 54 y.o.   MRN: [id number]  HPI Leslie Morgan presents to the employee health clinic to discuss her most recent lab results and for A1c testing. Discussed importance of heart healthy diet, increasing her water intake, and eating regularly throughout the day. She reports she is still having dizzy spells as before. States she feels like she is going to pass out when she makes sudden movements, denies room spinning sensation, states it feels more like she is light-headed. Denies any syncope, chest pain, shortness of breath, h/a, vision changes. States feeling lasts for just a moment and then she feels fine. She does not have a PCP.   Review of Systems See HPI    Objective:   Physical Exam Vitals reviewed.  Constitutional:      General: She is not in acute distress.    Appearance: Normal appearance. She is not toxic-appearing.  HENT:     Head: Normocephalic and atraumatic.  Pulmonary:     Effort: Pulmonary effort is normal. No respiratory distress.  Skin:    General: Skin is warm and dry.  Neurological:     Mental Status: She is alert and oriented to person, place, and time.  Psychiatric:        Mood and Affect: Mood normal.        Behavior: Behavior normal.    Today's Vitals   02/15/19 1445  BP: 100/60  Pulse: 62  SpO2: 99%   There is no height or weight on file to calculate BMI.     Assessment:     Impaired fasting glucose      Plan:     1. Pt's fasting glucose was 110 at last visit. Will check A1c to have a better idea of how her blood sugars are doing over time, and to confirm diagnosis of possible prediabetes. Will notify pt of results when available.  2. Discussed elevated cholesterol and importance of a heart healthy diet. She is not at a point where we need to treat with statin medication. Will repeat in 1 year.  3. Strongly encouraged her to find a PCP to establish routine care and to further evaluate  her dizziness should it continue. Discussed if symptoms persist she will need further workup. If symptoms worsen, she develops syncope, chest pain, shortness of breath, stroke-like symptoms she needs to be seen in the ED.

## 2019-02-16 LAB — HEMOGLOBIN A1C W/OUT EAG: Hgb A1c MFr Bld: 5.4 % of total Hgb (ref <5.7)

## 2019-04-10 ENCOUNTER — Other Ambulatory Visit: Payer: Self-pay | Admitting: Physician Assistant

## 2019-04-10 DIAGNOSIS — E2839 Other primary ovarian failure: Secondary | ICD-10-CM

## 2019-05-17 DIAGNOSIS — M5136 Other intervertebral disc degeneration, lumbar region: Secondary | ICD-10-CM | POA: Insufficient documentation

## 2019-06-12 ENCOUNTER — Other Ambulatory Visit: Payer: Self-pay

## 2019-06-12 ENCOUNTER — Other Ambulatory Visit: Payer: PRIVATE HEALTH INSURANCE

## 2019-06-12 ENCOUNTER — Ambulatory Visit
Admission: RE | Admit: 2019-06-12 | Discharge: 2019-06-12 | Disposition: A | Discharge status: Home / Self Care | Payer: PRIVATE HEALTH INSURANCE | Source: Ambulatory Visit | Attending: Physician Assistant | Admitting: Physician Assistant

## 2019-06-12 DIAGNOSIS — E2839 Other primary ovarian failure: Secondary | ICD-10-CM

## 2019-07-27 ENCOUNTER — Ambulatory Visit
Admission: RE | Admit: 2019-07-27 | Discharge: 2019-07-27 | Disposition: A | Discharge status: Home / Self Care | Payer: PRIVATE HEALTH INSURANCE | Source: Ambulatory Visit | Attending: Physician Assistant | Admitting: Physician Assistant

## 2019-07-27 ENCOUNTER — Other Ambulatory Visit: Payer: Self-pay | Admitting: Physician Assistant

## 2019-07-27 ENCOUNTER — Other Ambulatory Visit: Payer: Self-pay

## 2019-07-27 DIAGNOSIS — R0781 Pleurodynia: Secondary | ICD-10-CM

## 2019-07-27 DIAGNOSIS — M533 Sacrococcygeal disorders, not elsewhere classified: Secondary | ICD-10-CM

## 2020-03-12 ENCOUNTER — Other Ambulatory Visit: Payer: Self-pay | Admitting: Physician Assistant

## 2020-03-12 DIAGNOSIS — Z1231 Encounter for screening mammogram for malignant neoplasm of breast: Secondary | ICD-10-CM

## 2020-04-03 ENCOUNTER — Other Ambulatory Visit: Payer: Self-pay | Admitting: Physician Assistant

## 2020-04-03 ENCOUNTER — Other Ambulatory Visit (HOSPITAL_COMMUNITY)
Admission: RE | Admit: 2020-04-03 | Discharge: 2020-04-03 | Disposition: A | Discharge status: Home / Self Care | Payer: PRIVATE HEALTH INSURANCE | Source: Ambulatory Visit | Attending: Physician Assistant | Admitting: Physician Assistant

## 2020-04-03 DIAGNOSIS — Z0000 Encounter for general adult medical examination without abnormal findings: Secondary | ICD-10-CM | POA: Insufficient documentation

## 2020-04-08 LAB — CYTOLOGY - PAP
Adequacy: ABSENT
Comment: NEGATIVE
Diagnosis: NEGATIVE
High risk HPV: NEGATIVE

## 2020-04-21 ENCOUNTER — Ambulatory Visit
Admission: RE | Admit: 2020-04-21 | Discharge: 2020-04-21 | Disposition: A | Discharge status: Home / Self Care | Payer: PRIVATE HEALTH INSURANCE | Source: Ambulatory Visit | Attending: Physician Assistant | Admitting: Physician Assistant

## 2020-04-21 ENCOUNTER — Other Ambulatory Visit: Payer: Self-pay

## 2020-04-21 DIAGNOSIS — Z1231 Encounter for screening mammogram for malignant neoplasm of breast: Secondary | ICD-10-CM

## 2020-04-24 ENCOUNTER — Ambulatory Visit: Payer: PRIVATE HEALTH INSURANCE | Admitting: Family Medicine

## 2020-04-24 VITALS — BP 105/60 | HR 60

## 2020-04-24 DIAGNOSIS — M8580 Other specified disorders of bone density and structure, unspecified site: Secondary | ICD-10-CM | POA: Insufficient documentation

## 2020-04-24 DIAGNOSIS — E2839 Other primary ovarian failure: Secondary | ICD-10-CM | POA: Insufficient documentation

## 2020-04-24 DIAGNOSIS — M5430 Sciatica, unspecified side: Secondary | ICD-10-CM | POA: Insufficient documentation

## 2020-04-24 DIAGNOSIS — G8929 Other chronic pain: Secondary | ICD-10-CM | POA: Insufficient documentation

## 2020-04-24 DIAGNOSIS — E785 Hyperlipidemia, unspecified: Secondary | ICD-10-CM | POA: Insufficient documentation

## 2020-04-24 DIAGNOSIS — Z789 Other specified health status: Secondary | ICD-10-CM

## 2020-04-24 NOTE — Progress Notes (Signed)
Subjective:     Patient ID: Leslie Morgan, female   DOB: 09-Aug-1965, 55 y.o.   MRN: [id number]  HPI  Nashly presents to the employee health and wellness clinic today for her wellness exam required by her insurance. Her PCP is Roslynn Amble, [place]. She is UTD on her mammogram, colonoscopy and pap smear. She quit smoking in 2020 and is doing well with that. She reports trying to eat healthy and exercise, but loves [employer]. She was recently seen by her PCP for back pain and will start on prednisone today for that. Otherwise denies any current problems or concerns.  No past medical history on file. No Known Allergies  Current Outpatient Medications:  .  Calcium Citrate-Vitamin D (CALCIUM + D PO), 1 tab, Disp: , Rfl:  .  predniSONE (DELTASONE) 20 MG tablet, 3-3-3-2-2-1-1 tablet taper, Disp: , Rfl:  .  Vitamin D, Cholecalciferol, 10 MCG (400 UNIT) TABS, 2 capsule, Disp: , Rfl:  .  acetaminophen (TYLENOL) 500 MG tablet, Take 1,000 mg by mouth every 6 (six) hours as needed for moderate pain., Disp: , Rfl:  .  ibuprofen (ADVIL,MOTRIN) 200 MG tablet, Take 800 mg by mouth every 6 (six) hours as needed for moderate pain., Disp: , Rfl:  .  naproxen sodium (ANAPROX) 220 MG tablet, Take 440 mg by mouth 2 (two) times daily as needed (pain)., Disp: , Rfl:     Review of Systems  Constitutional: Negative for chills, fatigue, fever and unexpected weight change.  HENT: Negative for congestion, ear pain, sinus pressure, sinus pain and sore throat.   Eyes: Negative for discharge and visual disturbance.  Respiratory: Negative for cough, shortness of breath and wheezing.   Cardiovascular: Negative for chest pain and leg swelling.  Gastrointestinal: Negative for abdominal pain, blood in stool, constipation, diarrhea, nausea and vomiting.  Genitourinary: Negative for difficulty urinating and hematuria.  Skin: Negative for color change.  Neurological: Negative for dizziness, weakness, light-headedness and  headaches.  Hematological: Negative for adenopathy.  All other systems reviewed and are negative.      Objective:   Physical Exam Vitals reviewed.  Constitutional:      General: She is not in acute distress.    Appearance: Normal appearance. She is well-developed.  HENT:     Head: Normocephalic and atraumatic.  Eyes:     General:        Right eye: No discharge.        Left eye: No discharge.  Cardiovascular:     Rate and Rhythm: Normal rate and regular rhythm.     Heart sounds: Normal heart sounds.  Pulmonary:     Effort: Pulmonary effort is normal. No respiratory distress.     Breath sounds: Normal breath sounds.  Musculoskeletal:     Cervical back: Neck supple.  Skin:    General: Skin is warm and dry.  Neurological:     Mental Status: She is alert and oriented to person, place, and time.  Psychiatric:        Mood and Affect: Mood normal.        Behavior: Behavior normal.    Today's Vitals   04/24/20 1449  BP: 105/60  Pulse: 60  SpO2: 97%   There is no height or weight on file to calculate BMI.     Assessment:     Participant in health and wellness plan      Plan:     1. Keep all regular appt with PCP. UTD on screenings. Encouraged  healthy eating and increased physical activity. Offered to print off some back exercises she could do at home but pt refused. F/u here prn.

## 2021-09-16 IMAGING — MG MM DIGITAL SCREENING BILAT W/ TOMO AND CAD
8 series · 9 of 24 positions shown · non-contrast
Comparison: Previous exam(s).

CLINICAL DATA: Screening.

EXAM:
DIGITAL SCREENING BILATERAL MAMMOGRAM WITH TOMOSYNTHESIS AND CAD
TECHNIQUE: Bilateral screening digital craniocaudal and mediolateral oblique
mammograms were obtained. Bilateral screening digital breast
tomosynthesis was performed. The images were evaluated with
computer-aided detection.

[L MLO synth-2D]
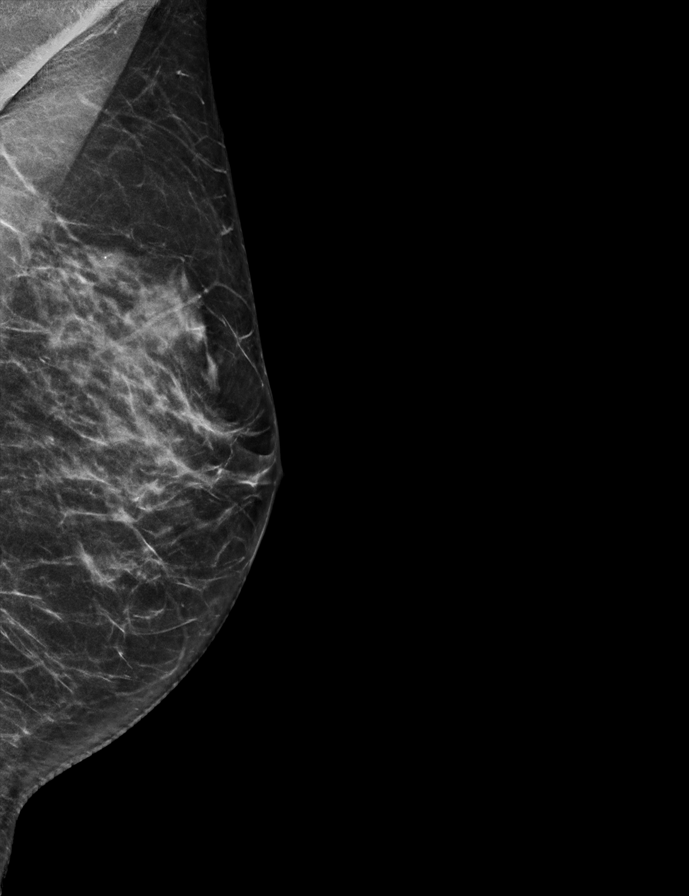

[R MLO synth-2D]
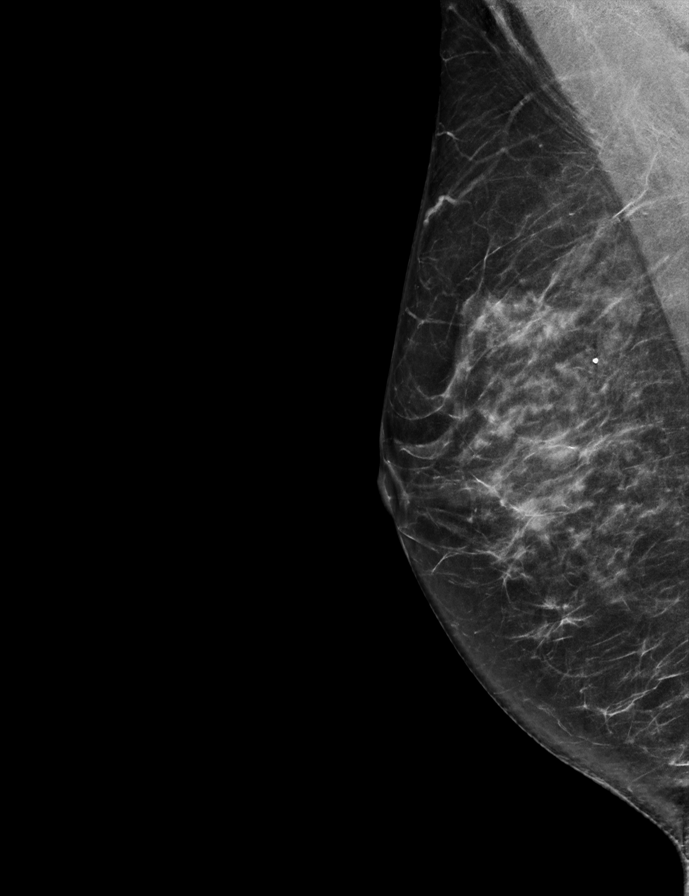

[R CC synth-2D]
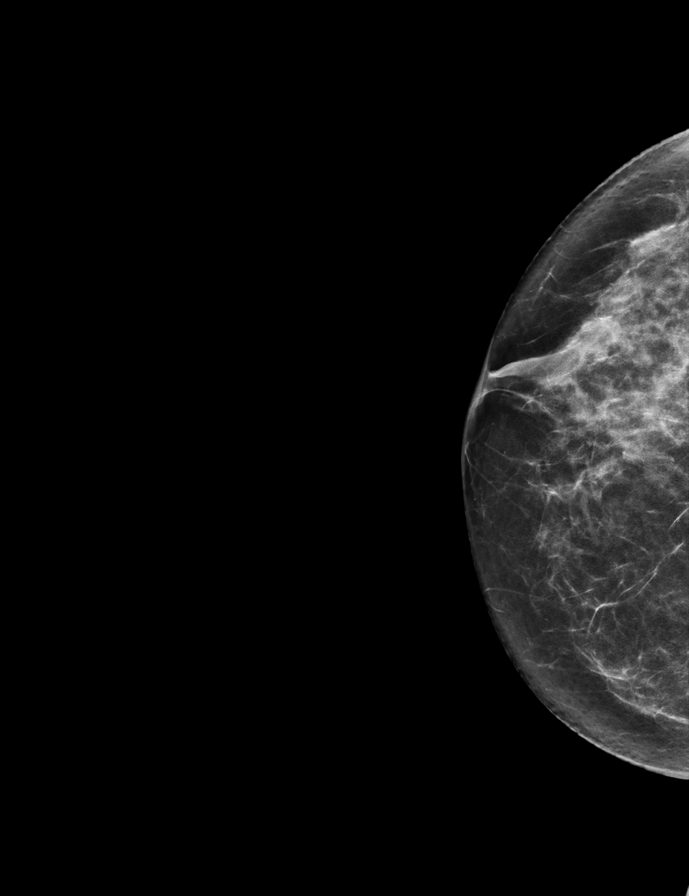

[L CC synth-2D]
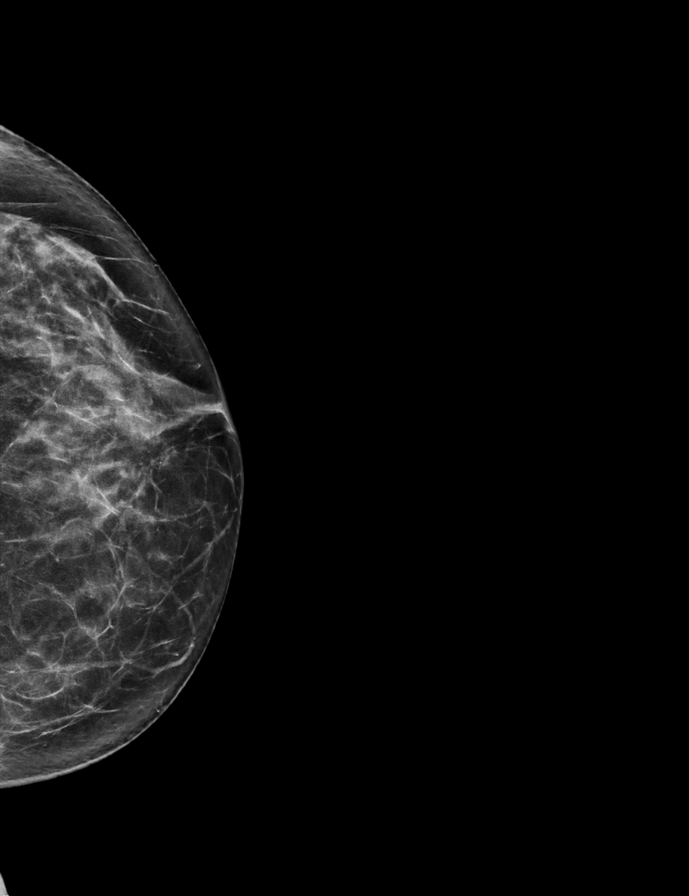

[L MLO tomo · 2 of 61 frames shown]
[frame 20/61]
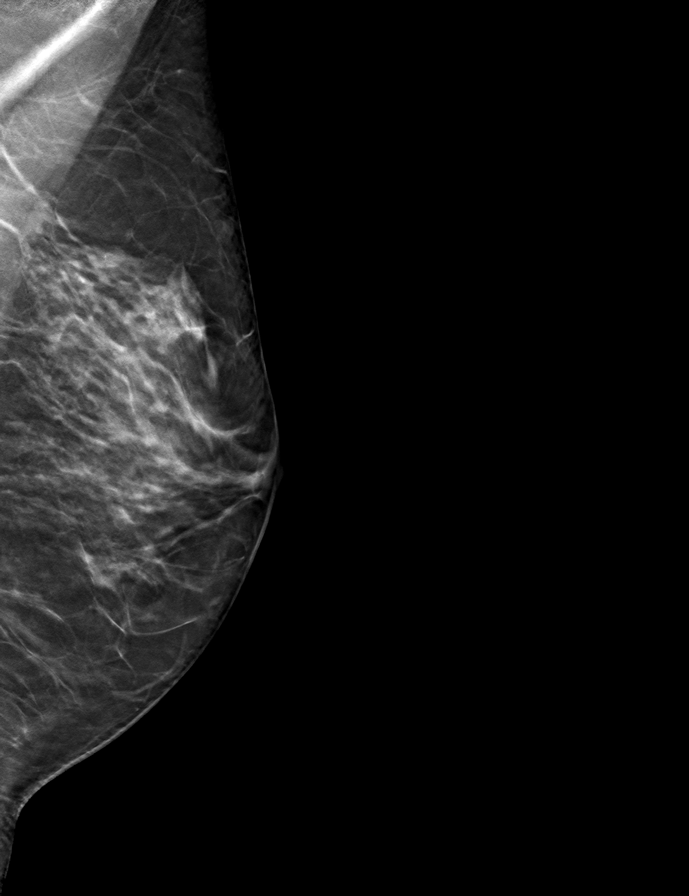
[frame 31/61]
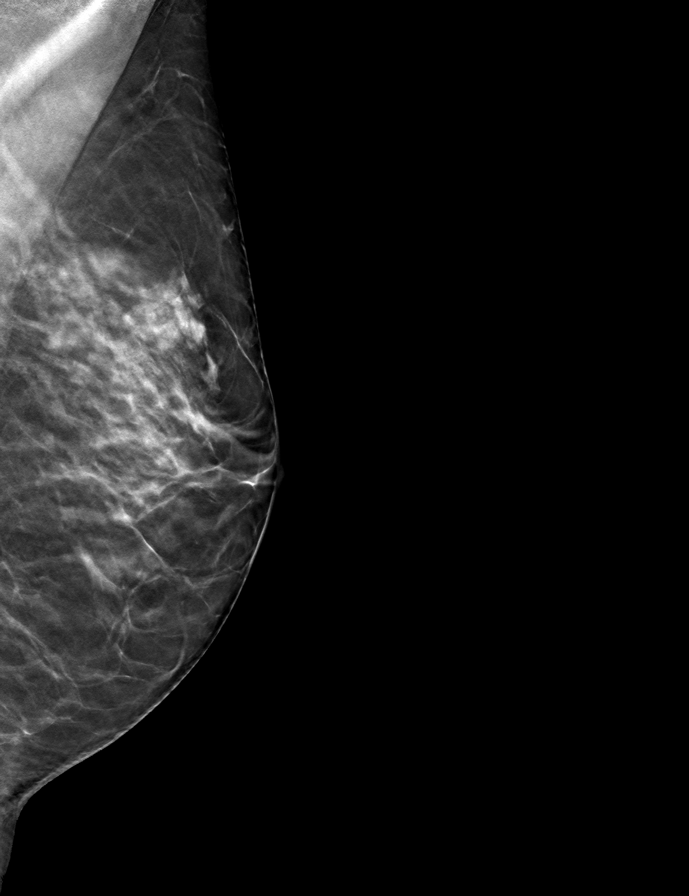

[R CC tomo · tomo slice 35/68.0]
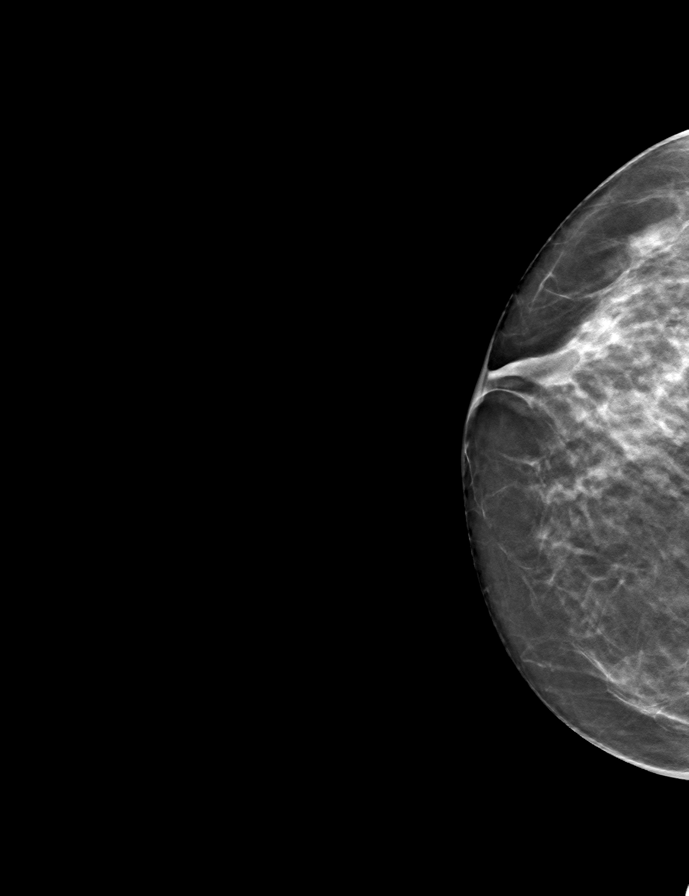

[L CC tomo · tomo slice 32/63.0]
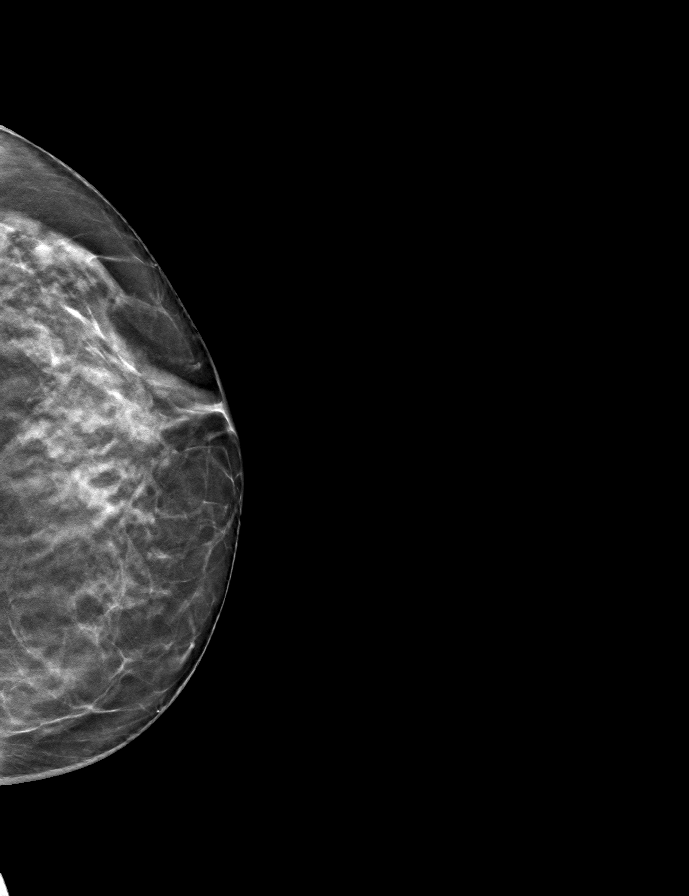

[R MLO tomo · tomo slice 33/66.0]
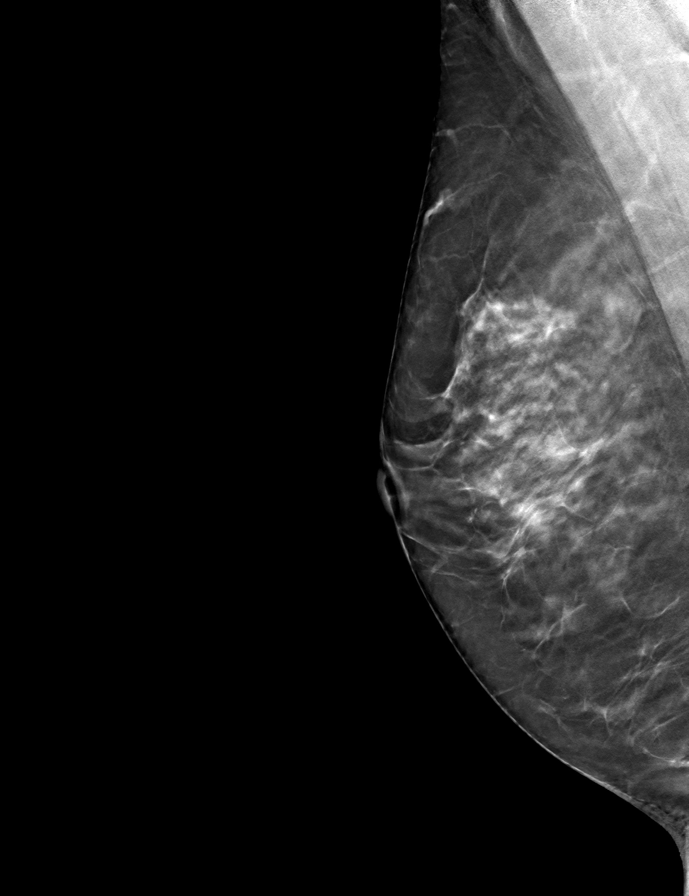

[9 of 24 positions shown; findings below may reference images not displayed]

ACR Breast Density Category c: The breast tissue is heterogeneously
dense, which may obscure small masses.
FINDINGS: There are no findings suspicious for malignancy. The images were
evaluated with computer-aided detection.
IMPRESSION: No mammographic evidence of malignancy. A result letter of this
screening mammogram will be mailed directly to the patient.

RECOMMENDATION:
Screening mammogram in one year. (Code:T4-5-GWO)

BI-RADS CATEGORY  1: Negative.

## 2021-10-08 ENCOUNTER — Encounter: Payer: Self-pay | Admitting: Nurse Practitioner

## 2021-10-08 ENCOUNTER — Ambulatory Visit: Payer: PRIVATE HEALTH INSURANCE | Admitting: Nurse Practitioner

## 2021-10-08 VITALS — BP 106/68 | HR 65

## 2021-10-08 DIAGNOSIS — Z1231 Encounter for screening mammogram for malignant neoplasm of breast: Secondary | ICD-10-CM

## 2021-10-08 DIAGNOSIS — Z789 Other specified health status: Secondary | ICD-10-CM

## 2021-10-08 NOTE — Progress Notes (Signed)
Subjective:  Patient ID: Leslie Morgan, female    DOB: 09/02/1965  Age: 56 y.o. MRN: [id number]  CC: Wellness exam   HPI [name] presents for wellness exam visit for insurance benefit.  Patient has a PCP: Roslynn Amble at [facility] significant for: Back pain with sciatica  Last labs per PCP were completed: 2022  Health Maintenance:  Colonoscopy: 2020, repeat in 3 years. Mammogram: reports done 2 years ago. Due for this    Smoker: former  Immunizations:  Shingrix-  interested in this.  COVID- x4  Lifestyle: Diet- no particular diet.  Exercise- active at work     No past medical history on file.  Past Surgical History:  Procedure Laterality Date   arm surgery      Outpatient Medications Prior to Visit  Medication Sig Dispense Refill   acetaminophen (TYLENOL) 500 MG tablet Take 1,000 mg by mouth every 6 (six) hours as needed for moderate pain.     Calcium Citrate-Vitamin D (CALCIUM + D PO) 1 tab     Vitamin D, Cholecalciferol, 10 MCG (400 UNIT) TABS 2 capsule     ibuprofen (ADVIL,MOTRIN) 200 MG tablet Take 800 mg by mouth every 6 (six) hours as needed for moderate pain. (Patient not taking: Reported on 10/08/2021)     naproxen sodium (ANAPROX) 220 MG tablet Take 440 mg by mouth 2 (two) times daily as needed (pain). (Patient not taking: Reported on 10/08/2021)     predniSONE (DELTASONE) 20 MG tablet 3-3-3-2-2-1-1 tablet taper (Patient not taking: Reported on 10/08/2021)     No facility-administered medications prior to visit.    ROS Review of Systems  Respiratory:  Negative for shortness of breath.   Cardiovascular:  Negative for chest pain and leg swelling.  Musculoskeletal:  Positive for back pain.  Neurological:  Negative for headaches.    Objective:  BP 106/68   Pulse 65   LMP 05/24/2016   SpO2 97%   Physical Exam Constitutional:      General: She is not in acute distress. HENT:     Head: Normocephalic.  Cardiovascular:     Rate  and Rhythm: Normal rate and regular rhythm.     Pulses: Normal pulses.     Heart sounds: Normal heart sounds.  Pulmonary:     Effort: Pulmonary effort is normal.     Breath sounds: Normal breath sounds.  Musculoskeletal:        General: Normal range of motion.     Right lower leg: No edema.     Left lower leg: No edema.  Skin:    General: Skin is warm.  Neurological:     General: No focal deficit present.     Mental Status: She is alert and oriented to person, place, and time.  Psychiatric:        Mood and Affect: Mood normal.        Behavior: Behavior normal.      Assessment & Plan:    Nelly was seen today for wellness exam.  Diagnoses and all orders for this visit:  Participant in health and wellness plan Adult wellness physical was conducted today. Importance of diet and exercise were discussed in detail.  We reviewed immunizations and gave recommendations regarding current immunization needed for age. Interested in Shingrix, she will let me know when she is interested in completing this.  Preventative health exams needed: Needs to update mammogram and colonoscopy. Patient is interested in completing mammogram via mobile mammogram  bus at [facility], November 1st. Order has been placed. Colonscopy order to be discussed with PCP.   Patient was advised yearly wellness exam and follow-up wit PCP as recommended.   Encounter for screening mammogram for breast cancer -     MM 3D SCREEN BREAST BILATERAL; Future    Orders Placed This Encounter  Procedures   MM 3D SCREEN BREAST BILATERAL    No orders of the defined types were placed in this encounter.   Follow-up: as needed.

## 2021-11-11 ENCOUNTER — Ambulatory Visit
Admission: RE | Admit: 2021-11-11 | Discharge: 2021-11-11 | Disposition: A | Discharge status: Home / Self Care | Payer: PRIVATE HEALTH INSURANCE | Source: Ambulatory Visit | Attending: Nurse Practitioner | Admitting: Nurse Practitioner

## 2021-11-11 DIAGNOSIS — Z1231 Encounter for screening mammogram for malignant neoplasm of breast: Secondary | ICD-10-CM

## 2023-06-25 ENCOUNTER — Emergency Department (HOSPITAL_COMMUNITY)
Admission: EM | Admit: 2023-06-25 | Discharge: 2023-06-25 | Disposition: A | Discharge status: Home / Self Care | Payer: PRIVATE HEALTH INSURANCE | Attending: Emergency Medicine | Admitting: Emergency Medicine

## 2023-06-25 ENCOUNTER — Emergency Department (HOSPITAL_COMMUNITY): Payer: PRIVATE HEALTH INSURANCE

## 2023-06-25 DIAGNOSIS — M5416 Radiculopathy, lumbar region: Secondary | ICD-10-CM | POA: Insufficient documentation

## 2023-06-25 LAB — URINALYSIS, W/ REFLEX TO CULTURE (INFECTION SUSPECTED)
Bilirubin Urine: NEGATIVE
Glucose, UA: NEGATIVE mg/dL
Hgb urine dipstick: NEGATIVE
Ketones, ur: NEGATIVE mg/dL
Leukocytes,Ua: NEGATIVE
Nitrite: NEGATIVE
Protein, ur: NEGATIVE mg/dL
Specific Gravity, Urine: 1.01 (ref 1.005–1.030)
pH: 6 (ref 5.0–8.0)

## 2023-06-25 MED ORDER — MORPHINE SULFATE (PF) 4 MG/ML IV SOLN
4.0000 mg | Freq: Once | INTRAVENOUS | Status: AC
  Filled 2023-06-25: qty 1

## 2023-06-25 MED ORDER — LIDOCAINE 5 % EX PTCH
1.0000 | MEDICATED_PATCH | CUTANEOUS | Status: DC
  Filled 2023-06-25: qty 1

## 2023-06-25 MED ORDER — KETOROLAC TROMETHAMINE 15 MG/ML IJ SOLN
15.0000 mg | Freq: Once | INTRAMUSCULAR | Status: AC
  Administered 2023-06-25: 15 mg via INTRAMUSCULAR
  Filled 2023-06-25: qty 1

## 2023-06-25 MED ORDER — OXYCODONE HCL 5 MG PO TABS
5.0000 mg | ORAL_TABLET | Freq: Once | ORAL | Status: AC
  Filled 2023-06-25: qty 1

## 2023-06-25 MED ORDER — OXYCODONE HCL 5 MG PO TABS: 5.0000 mg | ORAL_TABLET | ORAL | 0 refills | Status: AC | PRN

## 2023-06-25 MED ORDER — ACETAMINOPHEN 500 MG PO TABS
1000.0000 mg | ORAL_TABLET | Freq: Once | ORAL | Status: AC
  Filled 2023-06-25: qty 2

## 2023-06-25 MED ORDER — METHOCARBAMOL 500 MG PO TABS
500.0000 mg | ORAL_TABLET | Freq: Once | ORAL | Status: AC
  Filled 2023-06-25: qty 1

## 2023-06-25 NOTE — ED Triage Notes (Signed)
 Patient complains of back pain that radiates down her legs and knees. Has been hurting for about 2-3 weeks. Taking OTC meds and not getting any relief. Rates pain 9/10.

## 2023-06-25 NOTE — ED Provider Notes (Signed)
 [facility] AT [facility] Provider Note   CSN: [id number] Arrival date & time: 06/25/23  0915     History  No chief complaint on file.   Leslie Morgan is a 58 y.o. female with PMH as listed below who presents with lumbar back pain radiating down both legs x 1 week. Husband having to help her with ADLs because she is in so much pain. No leg weakness, saddle anesthesia, bowel/bladder incontinence. Radiates down to knees on both sides. Denies h/o cancer, IVDU, back surgery, remote trauma. No inciting trauma that she can remember. 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 Used to receive cortisone injections in her back with spine surgery several years ago but was told she had to stop d/t low bone density and was placed on calcium/vit D. No numbness/tingling. Has tried creams, advil, aleve, tylenol , and vicodin/hydrocodones that she had her husband had at home but nothing is helping. Denies urinary sxs or flank pain.  No past medical history on file.     Home Medications Prior to Admission medications   Medication Sig Start Date End Date Taking? Authorizing Provider  oxyCODONE  (ROXICODONE ) 5 MG immediate release tablet Take 1 tablet (5 mg total) by mouth every 4 (four) hours as needed for up to 18 doses for severe pain (pain score 7-10). 06/25/23  Yes Franklyn Sid SAILOR, MD  acetaminophen  (TYLENOL ) 500 MG tablet Take 1,000 mg by mouth every 6 (six) hours as needed for moderate pain.    [provider]  Calcium Citrate-Vitamin D (CALCIUM + D PO) 1 tab    [provider]  Vitamin D, Cholecalciferol, 10 MCG (400 UNIT) TABS 2 capsule    [provider]      Allergies    Patient has no known allergies.    Review of Systems   Review of Systems A 10 point review of systems was performed and is negative unless otherwise reported in HPI.  Physical Exam Updated Vital Signs BP 126/80   Pulse (!) 57   Temp 97.7 F (36.5 C)  (Oral)   Resp 16   LMP 05/24/2016   SpO2 100%  Physical Exam General: Normal appearing female, lying in bed.  HEENT: PERRLA, Sclera anicteric, MMM, trachea midline.  Cardiology: RRR, no murmurs/rubs/gallops. BL radial and DP pulses equal bilaterally.  Resp: Normal respiratory rate and effort. CTAB, no wheezes, rhonchi, crackles.  Abd: Soft, non-tender, non-distended. No rebound tenderness or guarding.  GU: Deferred. MSK: No peripheral edema or signs of trauma. Extremities without deformity or TTP. No cyanosis or clubbing. Skin: warm, dry. No rashes or lesions. Back: No CVA tenderness. +midline lumbar spine TTP.  Neuro: A&Ox4, CNs II-XII grossly intact. MAEs. Sensation grossly intact.  Psych: Normal mood and affect.   ED Results / Procedures / Treatments   Labs (all labs ordered are listed, but only abnormal results are displayed) Labs Reviewed  URINALYSIS, W/ REFLEX TO CULTURE (INFECTION SUSPECTED) - Abnormal; Notable for the following components:      Result Value   Bacteria, UA RARE (*)    All other components within normal limits    EKG None  Radiology No results found.  Procedures Procedures    Medications Ordered in ED Medications  methocarbamol  (ROBAXIN ) tablet 500 mg (500 mg Oral Given 06/25/23 1135)  acetaminophen  (TYLENOL ) tablet 1,000 mg (1,000 mg Oral Given 06/25/23 1134)  ketorolac  (TORADOL ) 15 MG/ML injection 15 mg (15 mg Intramuscular Given 06/25/23 1136)  oxyCODONE  (Oxy  IR/ROXICODONE ) immediate release tablet 5 mg (5 mg Oral Given 06/25/23 1134)  morphine  (PF) 4 MG/ML injection 4 mg (4 mg Intravenous Given 06/25/23 1310)  morphine  (PF) 4 MG/ML injection 4 mg (4 mg Intravenous Given 06/25/23 1423)    ED Course/ Medical Decision Making/ A&P                          Medical Decision Making Amount and/or Complexity of Data Reviewed Labs:  Decision-making details documented in ED Course. Radiology: ordered. Decision-making details documented in ED  Course.  Risk OTC drugs. Prescription drug management.    This patient presents to the ED for concern of radicular lower back pain, this involves an extensive number of treatment options, and is a complaint that carries with it a high risk of complications and morbidity.  I considered the following differential and admission for this acute, potentially life threatening condition.   MDM:    DDX for low back pain includes but is not limited to:   Consider MSK pain as presenting etiology, herniated, and back strain or sciatica. Presentation not consistent with malignancy (lack of history of malignancy, lack of B symptoms), cauda equina (no bowel or urinary incontinence/retention, no saddle anesthesia, no distal weakness), AAA, viscus perforation, osteomyelitis or epidural abscess (no IVDU, fever), renal colic, pyelonephritis (afebrile, no CVAT, no urinary symptoms). She does have midline TTP and will get CT to r/o fracture. Will give robaxin , tylenol , toradol , and oxycodone  for pain control along with lidocaine  patch.    Clinical Course as of 07/04/23 1550  Sat Jun 25, 2023  1156 Urinalysis, w/ Reflex to Culture (Infection Suspected) -Urine, Clean Catch(!) neg [HN]  1156 CT Lumbar Spine Wo Contrast 1. No acute findings. 2. Mild disc bulging and facet hypertrophy at L4-5 without focal stenosis. 3. Minimal disc bulging and mild facet hypertrophy at L5-S1 without focal stenosis.   [HN]  1254 Patient reports no change in her pain at all after tylenol , toradol , robaxin , oxycodone , and lidocaine  patch. Will give additional pain control. She is offered gabapentin but states she doesn't want it because she's taken it before but doesn't like the way it makes her feel. Will give IV morphine . [HN]    Clinical Course User Index [HN] Franklyn Sid SAILOR, MD    On reevaluation patient is improved and can ambulate around the department. Discussed with her about f/u with spine clinic and pain control at  home including tylenol , ibuprofen, heat/ice/massage, stretching, and oxycodone  for breakthrough pain for a few days. DC w/ discharge instructions/return precautions. All questions answered to patient's satisfaction.     Labs: I Ordered, and personally interpreted labs.  The pertinent results include:  those listed above  Imaging Studies ordered: I ordered imaging studies including CT lumbar spine I independently visualized and interpreted imaging. I agree with the radiologist interpretation  Additional history obtained from chart review, husband at bedside.    Cardiac Monitoring: The patient was maintained on a cardiac monitor.  I personally viewed and interpreted the cardiac monitored which showed an underlying rhythm of: NSR  Reevaluation: After the interventions noted above, I reevaluated the patient and found that they have :improved  Social Determinants of Health:  lives independently  Disposition:  DC  Co morbidities that complicate the patient evaluation No past medical history on file.   Medicines Meds ordered this encounter  Medications   DISCONTD: lidocaine  (LIDODERM ) 5 % 1 patch   methocarbamol  (ROBAXIN ) tablet 500 mg  acetaminophen  (TYLENOL ) tablet 1,000 mg   ketorolac  (TORADOL ) 15 MG/ML injection 15 mg   oxyCODONE  (Oxy IR/ROXICODONE ) immediate release tablet 5 mg    Refill:  0   morphine  (PF) 4 MG/ML injection 4 mg   morphine  (PF) 4 MG/ML injection 4 mg   oxyCODONE  (ROXICODONE ) 5 MG immediate release tablet    Sig: Take 1 tablet (5 mg total) by mouth every 4 (four) hours as needed for up to 18 doses for severe pain (pain score 7-10).    Dispense:  18 tablet    Refill:  0    I have reviewed the patients home medicines and have made adjustments as needed  Problem List / ED Course: Problem List Items Addressed This Visit   None Visit Diagnoses       Acute radicular low back pain    -  Primary   Relevant Medications   methocarbamol  (ROBAXIN ) tablet 500 mg  (Completed)   acetaminophen  (TYLENOL ) tablet 1,000 mg (Completed)   ketorolac  (TORADOL ) 15 MG/ML injection 15 mg (Completed)   oxyCODONE  (Oxy IR/ROXICODONE ) immediate release tablet 5 mg (Completed)   morphine  (PF) 4 MG/ML injection 4 mg (Completed)   morphine  (PF) 4 MG/ML injection 4 mg (Completed)   oxyCODONE  (ROXICODONE ) 5 MG immediate release tablet                   This note was created using dictation software, which may contain spelling or grammatical errors.    Franklyn Sid SAILOR, MD 07/04/23 [phone]

## 2023-06-25 NOTE — Discharge Instructions (Addendum)
 Thank you for coming to [facility] Emergency Department. You were seen for back pain. We did an exam, labs, and imaging, and these showed bulging discs. Please follow up with your spine doctor within 1 week. You can also follow up with pain management. You can alternate taking Tylenol  and ibuprofen as needed for pain. You can take 650mg  tylenol  (acetaminophen ) every 4-6 hours, and 600 mg ibuprofen 3 times a day. You can take oxycodone  5 mg every 4-6 hours for breakthrough pain. Heat, ice, massage, and stretching can also help.   Return to the ED if you develop any of the following: - Fever (100.4 F or 38 C) or chills at home that do not respond to over the counter medications - Weakness, numbness, or tingling in your extremities - Difficulty emptying bladder / urinary incontinence - Fecal incontinence - Uncontrolled nausea/vomiting with inability to keep down liquids - Feeling as though you are going to pass out or passing out - Anything else that concerns you

## 2023-07-12 ENCOUNTER — Other Ambulatory Visit: Payer: Self-pay | Admitting: Nurse Practitioner

## 2023-07-12 DIAGNOSIS — Z1231 Encounter for screening mammogram for malignant neoplasm of breast: Secondary | ICD-10-CM

## 2023-07-12 NOTE — Progress Notes (Signed)
 Patient is interested in obtaining screening mammogram via mobile mammogram bus at [facility]. No concerns at this time. Last mammogram:2023  Order placed.

## 2023-08-03 ENCOUNTER — Ambulatory Visit
Admission: RE | Admit: 2023-08-03 | Discharge: 2023-08-03 | Disposition: A | Discharge status: Home / Self Care | Payer: PRIVATE HEALTH INSURANCE | Source: Ambulatory Visit | Attending: Nurse Practitioner | Admitting: Nurse Practitioner

## 2023-08-03 DIAGNOSIS — Z1231 Encounter for screening mammogram for malignant neoplasm of breast: Secondary | ICD-10-CM

## 2023-08-16 NOTE — Progress Notes (Signed)
 [facility] Orthopaedic Spine Surgery Outpatient Visit   Erandy Mceachern  DOB: 1965-02-04  MRN: [id number] 08/16/2023     Subjective:    Leslie Morgan is a 58 y.o. (DOB 09-04-1965) female.     Patient presents with  . Follow-up    3 week  Last shot did not relieve any pain      HPI The patient is a 58 year old female who presents for back pain.   In June 2025, she experienced a sudden onset of severe back pain that rendered her unable to walk or dress without assistance. Denies any injury or inciting event. The pain radiated into her right leg, reminiscent of a sciatica episode she had nearly a decade ago. She also reported swelling in her knees. Despite her attempts to continue working, the pain has been debilitating, forcing her to take time off work. She continues to experience pain radiating down her right hip, and radiating laterally into there right knee, but not beyond. She also has pain on her left side, but states the right side is worst. She also reports numbness and tingling in her right leg, particularly when descending stairs. She has been managing the pain with a heating pad and IcyHot. She was seen at the ED for the pain and a CT scan was performed during her hospital stay. She has been taking methocarbamol , ibuprofen, and Aleve, but these have not provided relief. She has been using a heating pad and IcyHot for relief. Oxycodone  5 mg has provided some relief, enabling her to perform basic tasks such as dressing.   Last visit we reviewed her lumbar mri and it did not show any significant neural compression. Her bilateral GT bursitis pain was likely causing her significant relief. Therefore we administered a steroid injection into R GT bursa. Today she states she found only minimal relief. She is visibly frustrated.Today states her left hip is the worst. Continues to report it radiates laterally stopping to above the knee.    Supplemental  Information She has a history of sciatica approximately 10 years ago, which was managed with physical therapy and injections. However, due to concerns about bone health, further injections were discontinued, and she was started on calcium and vitamin D supplements.  SOCIAL HISTORY She works in housekeeping.   MEDICATIONS Current: calcium, vitamin D, Nexium, gabapentin 100mg  TID, oxycodone  10mg  TID, methocarbamol , ibuprofen, Aleve. Failed meloxicam.  IMAGING Lumbar XR 07/06/23 showing moderate spondylosis with facet arthrosis seen. No acute fractures or listhesis seen.   Lumbar MRI 07/09/23 reviewed again today showing Lower lumbar disc desiccation with minimal facet arthropathy and disc bulging most significant at L4-5. There is a tiny left subarticular disc protrusion at this level. No significant central canal stenosis or neuroforaminal narrowing in the lumbar spine.    Reviewed and updated this visit by provider: Tobacco  Allergies  Meds  Problems  Med Hx  Surg Hx  Fam Hx       Review of Systems  Constitutional:  Negative for activity change, appetite change, chills, diaphoresis, fatigue, fever and unexpected weight change.  HENT: Negative.    Eyes: Negative.   Respiratory: Negative.  Negative for shortness of breath.   Cardiovascular:  Negative for chest pain, palpitations and leg swelling.  Gastrointestinal: Negative.   Endocrine: Negative.   Genitourinary: Negative.   Musculoskeletal:  Positive for back pain. Negative for arthralgias, gait problem, joint swelling, myalgias and neck pain.  Skin:  Negative for color change and wound.  Allergic/Immunologic: Negative.   Neurological:  Negative for weakness, numbness and headaches.  Hematological: Negative.   Psychiatric/Behavioral: Negative.  Negative for hallucinations, self-injury and sleep disturbance.       Objective:   Vitals:   08/16/23 1344  PainSc:   9    Constitutional: She appears healthy. No distress.   Musculoskeletal:        General: No tenderness or edema.     Cervical back: Normal range of motion.     Lumbar back: Negative right straight leg raise test and negative left straight leg raise test.  Neurological: She is alert and oriented to person, place, and time.  Skin: Skin is warm and dry.  Right Hip Exam   Tenderness  The patient is experiencing tenderness in the greater trochanter.  Range of Motion  The patient has normal right hip ROM.  Muscle Strength  The patient has normal right hip strength.   Back Exam   Tenderness  The patient is experiencing tenderness in the lumbar (right side parapsinal).  Range of Motion  Extension:  normal  Flexion:  normal  Lateral bend right:  normal  Lateral bend left:  normal  Rotation right:  normal  Rotation left:  normal   Muscle Strength  The patient has normal back strength.  Tests  Straight leg raise right: negative Straight leg raise left: negative  Reflexes  Patellar:  normal Achilles:  normal Biceps:  normal Babinski's sign: normal   Other  Toe walk: normal Heel walk: normal Sensation: normal Gait: normal  Erythema: no back redness Scars: absent            Assessment / Plan:   ASSESSMENT 1. 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 Trochanteric bursitis of left hip -     lidocaine  (XYLOCAINE ) 1% injection 4 mL; 4 mL, Other, Once, On Tue 08/16/23 at 1430, For 1 dose -     triamcinolone acetonide (KENALOG-40) 40 mg/mL injection 40 mg; 40 mg, Intramuscular, Once, On Tue 08/16/23 at 1430, For 1 dose -     AMB REFERRAL TO PHYSICAL THERAPY EVALUATION AND TREATMENT -     celecoxib (CELEBREX) 200 mg capsule; Take one capsule (200 mg dose) by mouth daily., Starting Tue 08/16/2023, Normal 3.  Greater trochanteric bursitis of right hip -     celecoxib (CELEBREX) 200 mg capsule; Take one capsule (200 mg dose) by mouth daily., Starting Tue 08/16/2023, Normal     PLAN At this point we discussed things at length. Her MRI does show some degeneration and facet arthrosis which could explain her back pain, but it does not explain all her symptoms in her hips. On physical exam, she does display sharp pain in bilateral GT bursa region making her pain most c/w bursitis pain. We discussed a steroid injection into the left as it is the worst today and patient expressed interest.   Procedure: Patient with a significant pain today.  Steroid injection was completed for acute pain relief  Left GT bursitis pain.  Risk and benefits of procedure were discussed and questions were answered.  Informed consent was obtained.  Timeout was performed.  The skin overlying the injection site was cleansed with Betadine and alcohol.  Site was anesthetized with ethyl chloride spray.  Injection was  completed with a 25-gauge 1.5 inch needle.  After negative aspiration, injection was completed using 40 mg of triamcinolone and 4 mL of 1% lidocaine .  Needle withdrawn intact from injection site.  Homeostasis was achieved.  No immediate complications were noted.  Patient tolerated the procedure well.  Since she did not find any benefit from gabapentin or meloxicam, plan to discontinue. New rx for celebrex sent today. We also discussed benefits of PT for the LBP and bursitis pain and she expressed interest. New orders sent. Questions were encouraged and answered. Patient was instructed to call with any new concerns or problems. F/u in 6 wks for recheck. Consider US  guided injections at that point.   Risks, benefits, and alternatives of the medications and treatment plan prescribed today were discussed, and patient expressed understanding. Plan follow-up as discussed or as needed if any worsening symptoms or change in condition.     Elveria Hoit, PA   Documentation for time-based billing:  Total time spent of date of service was 30 minutes.  Patient care activities included preparing to see the patient such as reviewing the patient record, counseling and educating the patient, family, and/or caregiver, ordering prescription medications, tests, or procedures, documenting clinical information in the electronic or other health record, and performing a steroid injection.

## 2023-08-25 ENCOUNTER — Encounter: Payer: Self-pay | Admitting: Nurse Practitioner

## 2023-08-25 ENCOUNTER — Ambulatory Visit: Payer: Self-pay | Admitting: Nurse Practitioner

## 2023-08-25 VITALS — BP 108/68 | HR 77 | Temp 97.4°F

## 2023-08-25 DIAGNOSIS — Z23 Encounter for immunization: Secondary | ICD-10-CM

## 2023-08-25 DIAGNOSIS — Z789 Other specified health status: Secondary | ICD-10-CM

## 2023-08-25 NOTE — Progress Notes (Signed)
 Occupational [facility]- [facility]  Subjective:  Patient ID: Leslie Morgan, female    DOB: 02/10/1965  Age: 58 y.o. MRN: [id number]  CC: wellness exam   HPI Leslie Morgan presents for wellness exam visit for insurance benefit.  Patient has a PCP: Bascom Necessary at [facility] significant for: following pain management for back pain.  Last labs per PCP were completed: a week ago per PCP  Health Maintenance:  Colonoscopy:completed a few years ago. Reports callback in 3 years. Due for this.  Mammogram: June 2025, repeat in 1 year Pap: March 2022- negative, due 2027    Smoker: former smoker. 1/2 pack day for 30 years.   Immunizations:  Shingrix - never done, agreeable to this.  Tdap-  completed 2022 Flu- gets this yearly  Lifestyle: Diet- no particular diet. Exercise- active at work.     No past medical history on file.  Past Surgical History:  Procedure Laterality Date   arm surgery      Outpatient Medications Prior to Visit  Medication Sig Dispense Refill   acetaminophen  (TYLENOL ) 500 MG tablet Take 1,000 mg by mouth every 6 (six) hours as needed for moderate pain.     Calcium Citrate-Vitamin D (CALCIUM + D PO) 1 tab     celecoxib (CELEBREX) 200 MG capsule Take 200 mg by mouth.     gabapentin (NEURONTIN) 100 MG tablet take 1/2 tablet Orally daily prn; Duration: 30 days     oxyCODONE  (ROXICODONE ) 5 MG immediate release tablet Take 1 tablet (5 mg total) by mouth every 4 (four) hours as needed for up to 18 doses for severe pain (pain score 7-10). 18 tablet 0   Vitamin D, Cholecalciferol, 10 MCG (400 UNIT) TABS 2 capsule     No facility-administered medications prior to visit.    ROS Review of Systems  Constitutional:  Negative for activity change.  HENT:  Negative for hearing loss.   Eyes:  Negative for visual disturbance.  Respiratory:  Negative for shortness of breath.   Cardiovascular:  Negative for chest pain.  Gastrointestinal:  Negative for constipation,  diarrhea, nausea and vomiting.  Musculoskeletal:  Positive for arthralgias and back pain.  Neurological:  Negative for dizziness.  Psychiatric/Behavioral:  Negative for sleep disturbance.     Objective:  BP 108/68 (BP Location: Left Arm, Patient Position: Sitting, Cuff Size: Normal)   Pulse 77   Temp (!) 97.4 F (36.3 C) (Temporal)   LMP 05/24/2016   SpO2 97%   Physical Exam Constitutional:      General: She is not in acute distress. HENT:     Head: Normocephalic.     Right Ear: Tympanic membrane, ear canal and external ear normal.     Left Ear: Tympanic membrane, ear canal and external ear normal.     Mouth/Throat:     Pharynx: Oropharynx is clear. No oropharyngeal exudate or posterior oropharyngeal erythema.  Eyes:     Pupils: Pupils are equal, round, and reactive to light.  Cardiovascular:     Rate and Rhythm: Normal rate and regular rhythm.     Heart sounds: Normal heart sounds.  Pulmonary:     Effort: Pulmonary effort is normal.     Breath sounds: Normal breath sounds.  Musculoskeletal:        General: Normal range of motion.  Neurological:     General: No focal deficit present.     Mental Status: She is alert and oriented to person, place, and time.  Psychiatric:  Mood and Affect: Mood normal.        Behavior: Behavior normal.      Assessment & Plan:    Wiley was seen today for wellness exam.  Diagnoses and all orders for this visit:  Participant in health and wellness plan Adult wellness physical was conducted today. Importance of diet and exercise were discussed in detail.  We reviewed immunizations and gave recommendations regarding current immunization needed for age. Patient will get first dose of shingrix  today.  Preventative health exams needed: Colonoscopy- to discuss with PCP  Patient was advised yearly wellness exam and follow-up with PCP as scheduled.    Immunization due 1st Dose of Shingrix  given today. CDC VIS given prior to  injection.  Tolerated well. No immediate adverse reactions noted. Educated and discussed red flag symptoms and to call 911 or go to emergency room with signs of anaphylaxis.  Take OTC tylenol  as needed for expected side effects such as muscle aches or fever.  Patient given the chance to ask all questions and discussed answers.  RTC in 2 months for 2nd dose of shingrix  vaccine to complete series, or sooner as needed.     -     Zoster Recombinant (Shingrix  )    Orders Placed This Encounter  Procedures   Zoster Recombinant (Shingrix  )    No orders of the defined types were placed in this encounter.   Follow-up: as needed.

## 2023-10-27 ENCOUNTER — Ambulatory Visit: Payer: Self-pay | Admitting: Nurse Practitioner

## 2023-10-27 DIAGNOSIS — Z23 Encounter for immunization: Secondary | ICD-10-CM

## 2023-10-27 NOTE — Progress Notes (Signed)
2nd Dose of Shingrix given today.  Tolerated well. No immediate adverse reactions noted. Educated and discussed red flag symptoms and to call 911 or go to emergency room with signs of anaphylaxis.  Take OTC tylenol as needed for expected side effects such as muscle aches or fever.  Patient given the chance to ask all questions and discussed answers.  RTC as needed.
